# Patient Record
Sex: Female | Born: 1990 | Race: Black or African American | Hispanic: No | Marital: Single | Smoking: Never smoker
Health system: Southern US, Community
[De-identification: ages and names within clinical notes are randomized; demographics above are authoritative.]

## PROBLEM LIST (undated history)

## (undated) DIAGNOSIS — Z789 Other specified health status: Secondary | ICD-10-CM

## (undated) HISTORY — PX: NO PAST SURGERIES: SHX2092

## (undated) HISTORY — PX: TONSILLECTOMY: SUR1361

## (undated) HISTORY — PX: OVARIAN CYST SURGERY: SHX726

---

## 2013-11-13 ENCOUNTER — Inpatient Hospital Stay (HOSPITAL_COMMUNITY)
Admission: AD | Admit: 2013-11-13 | Discharge: 2013-11-13 | Disposition: A | Discharge status: Home / Self Care | Payer: Self-pay | Source: Ambulatory Visit | Attending: Obstetrics and Gynecology | Admitting: Obstetrics and Gynecology

## 2013-11-13 ENCOUNTER — Encounter (HOSPITAL_COMMUNITY): Payer: Self-pay | Admitting: *Deleted

## 2013-11-13 DIAGNOSIS — R079 Chest pain, unspecified: Secondary | ICD-10-CM | POA: Insufficient documentation

## 2013-11-13 DIAGNOSIS — R1013 Epigastric pain: Secondary | ICD-10-CM | POA: Insufficient documentation

## 2013-11-13 DIAGNOSIS — R5383 Other fatigue: Secondary | ICD-10-CM

## 2013-11-13 DIAGNOSIS — K224 Dyskinesia of esophagus: Secondary | ICD-10-CM | POA: Insufficient documentation

## 2013-11-13 DIAGNOSIS — M25519 Pain in unspecified shoulder: Secondary | ICD-10-CM | POA: Insufficient documentation

## 2013-11-13 DIAGNOSIS — Z3202 Encounter for pregnancy test, result negative: Secondary | ICD-10-CM | POA: Insufficient documentation

## 2013-11-13 DIAGNOSIS — R110 Nausea: Secondary | ICD-10-CM

## 2013-11-13 HISTORY — DX: Other specified health status: Z78.9

## 2013-11-13 LAB — URINALYSIS, ROUTINE W REFLEX MICROSCOPIC
BILIRUBIN URINE: NEGATIVE
Glucose, UA: NEGATIVE mg/dL
Hgb urine dipstick: NEGATIVE
KETONES UR: 15 mg/dL — AB
LEUKOCYTES UA: NEGATIVE
NITRITE: NEGATIVE
PROTEIN: NEGATIVE mg/dL
Specific Gravity, Urine: 1.02 (ref 1.005–1.030)
UROBILINOGEN UA: 1 mg/dL (ref 0.0–1.0)
pH: 6.5 (ref 5.0–8.0)

## 2013-11-13 LAB — HCG, QUANTITATIVE, PREGNANCY: hCG, Beta Chain, Quant, S: <1 m[IU]/mL (ref <5)

## 2013-11-13 NOTE — MAU Note (Signed)
I haven't had a period and i've been having chest pains in my R upper chest. Pain is sharp when it comes. I have gotten 4 positive upts.

## 2013-11-13 NOTE — Discharge Instructions (Signed)
Preparing for Pregnancy Preparing for pregnancy (preconceptual care) by getting counseling and information from your caregiver before getting pregnant is a good idea. It will help you and your baby have a better chance to have a healthy, safe pregnancy and delivery of your baby. Make an appointment with your caregiver to talk about your health, medical, and family history and how to prepare yourself before getting pregnant. Your caregiver will do a complete physical exam and a Pap test. They will want to know:  About you, your spouse or partner, and your family's medical and genetic history.  If you are eating a balanced diet and drinking enough fluids.  What vitamins and mineral supplements you are taking. This includes taking folic acid before getting pregnant to help prevent birth defects.  What medications you are taking including prescription, over-the-counter and herbal medications.  If there is any substance abuse like alcohol, smoking, and illegal drugs.  If there is any mental or physical domestic violence.  If there is any risk of sexually transmitted disease between you and your partner.  What immunizations and vaccinations you have had and what you may need before getting pregnant.  If you should get tested for HIV infection.  If there is any exposure to chemical or toxic substances at home or work.  If there are medical problems you have that need to be treated and kept under control before getting pregnant such as diabetes, high blood pressure or others.  If there were any past surgeries, pregnancies and problems with them.  What your current weight is and to set a goal as to how much weight you should gain while pregnant. Also, they will check if you should lose or gain weight before getting pregnant.  What is your exercise routine and what it is safe when you are pregnant.  If there are any physical disabilities that need to be addressed.  About spacing your  pregnancies when there are other children.  If there is a financial problem that may affect you having a child. After talking about the above points with your caregiver, your caregiver will give you advice on how to help treat and work with you on solving any issues, if necessary, before getting pregnant. The goal is to have a healthy and safe pregnancy for you and your baby. You should keep an accurate record of your menstrual periods because it will help in determining your due date. Immunizations that you should have before getting pregnant:   Regular measles, German measles (rubella) and mumps.  Tetanus and diphtheria.  Chickenpox, if not immune.  Herpes zoster (Varicella) if not immune.  Human papilloma virus vaccine (HPV) between the age of 9 and 26 years old.  Hepatitis A vaccine.  Hepatitis B vaccine.  Influenza vaccine.  Pneumococcal vaccine (pneumonia). You should avoid getting pregnant for one month after getting vaccinated with a live virus vaccine such as German measles (rubella) which is in the MMR (Measles, Mumps and Rubella) vaccine. Other immunizations may be necessary depending on where you live, such as malaria. Ask your caregiver if any other immunizations are needed for you. HOME CARE INSTRUCTIONS   Follow the advice of your caregiver.  Before getting pregnant:  Begin taking vitamins, supplements, and 0.4 milligrams folic acid daily.  Get your immunizations up-to-date.  Get help from a nutrition counselor if you do not understand what a balanced diet is, need help with a special medical diet or if you need help to lose or gain weight.    Begin exercising.  Stop smoking, taking illegal drugs, and drinking alcoholic beverages.  Get counseling if there is and type of domestic violence.  Get checked for sexually transmitted diseases including HIV.  Get any medical problems under control (diabetes, high blood pressure, convulsions, asthma or  others).  Resolve any financial concerns or create a plan to do so.  Be sure you and your spouse or partner are ready to have a baby.  Keep an accurate record of your menstrual periods. Document Released: 09/12/2008 Document Revised: 07/21/2013 Document Reviewed: 09/12/2008 Exit[employer] Patient Information 2014 [employer].  

## 2013-11-13 NOTE — MAU Provider Note (Signed)
History     CSN: [id number]  Arrival date and time: 11/13/13 2048   None     Chief Complaint  Patient presents with  . Possible Pregnancy  . Abdominal Pain   HPI  23 yo G0 here for + UPT at home and question about an episode of pain she had several weeks ago.   - had been feeling nauseated. Went to a Psychiatric nurse and took a pregnancy test and was positive, then took another of a different brand and it was negative and then took 3 more of the same brand as the first and they were positive.  - has been feeling really tired - less appetite - came to get an answer because she isn't sure why they were different.   LMP- unsure of dates. Very irregular Sexually active with on and off protection.   No fevers, chills, vomiting, abdominal pain, dysuria, hematuria.    Also had a one time episode (2 weeks ago) of drinking soda and then having sharp epigastric and right sided shoulder pain. Lasted for 2-3 min and went away. Hasn't had any since.  Held her chest and that made it better.  Resolved spontaneously. No associated diaphoresis, SOB, nausea, radiation.        OB History   Grav Para Term Preterm Abortions TAB SAB Ect Mult Living   0               Past Medical History  Diagnosis Date  . Medical history non-contributory     Past Surgical History  Procedure Laterality Date  . No past surgeries      Family History  Problem Relation Age of Onset  . Cancer Maternal Grandmother     History  Substance Use Topics  . Smoking status: Never Smoker   . Smokeless tobacco: Not on file  . Alcohol Use: No    Allergies: No Known Allergies  No prescriptions prior to admission    Review of Systems  All other systems reviewed and are negative.   Physical Exam   Blood pressure 119/83, pulse 100, temperature 98.4 F (36.9 C), resp. rate 16, height 5\' 7"  (1.702 m), weight 98.158 kg (216 lb 6.4 oz).  Physical Exam  Constitutional: She is oriented to person, place, and  time. She appears well-developed and well-nourished.  HENT:  Head: Normocephalic.  Eyes: Pupils are equal, round, and reactive to light.  Neck: Neck supple.  Cardiovascular: Normal rate, regular rhythm, normal heart sounds and intact distal pulses.   No murmur heard. Respiratory: Effort normal and breath sounds normal. She has no wheezes. She has no rales. She exhibits no tenderness.  GI: Soft. Bowel sounds are normal. She exhibits no distension. There is no tenderness.  Musculoskeletal: Normal range of motion.  Neurological: She is alert and oriented to person, place, and time.  Skin: Skin is warm and dry.  Psychiatric: She has a normal mood and affect.    MAU Course  Procedures    Assessment and Plan  1) + home UPT - neg upt here - HCG quant sent and was <1 - discussed with pt the results and that she is not currently pregnant - discussed that should she not want to be pregnant now, she should consider some form of contraception in the future - encouraged her to f/u with PCP for this  2) epigastric pain - most consistent on hx with an esophageal spasm associated with soda on taht occurrence.  Low suspicion for cardiac cause  given resolution, no reoccurrence, location and story.   - reassurance given - advised to talk to PCP should it happen again.   3) d/c to home tonight   Juanna Pudlo L 11/13/2013, 11:47 PM

## 2013-11-14 NOTE — MAU Provider Note (Signed)
Attestation of Attending Supervision of Advanced Practitioner (CNM/NP): Evaluation and management procedures were performed by the Advanced Practitioner under my supervision and collaboration.  I have reviewed the Advanced Practitioner's note and chart, and I agree with the management and plan.  Shanea Karney 11/14/2013 2:30 AM   

## 2013-11-15 LAB — POCT PREGNANCY, URINE: Preg Test, Ur: NEGATIVE

## 2014-05-31 ENCOUNTER — Encounter (HOSPITAL_COMMUNITY): Payer: Self-pay | Admitting: Emergency Medicine

## 2014-05-31 ENCOUNTER — Emergency Department (HOSPITAL_COMMUNITY)
Admission: EM | Admit: 2014-05-31 | Discharge: 2014-05-31 | Disposition: A | Discharge status: Home / Self Care | Payer: Self-pay | Attending: Emergency Medicine | Admitting: Emergency Medicine

## 2014-05-31 DIAGNOSIS — Z789 Other specified health status: Secondary | ICD-10-CM | POA: Insufficient documentation

## 2014-05-31 DIAGNOSIS — Z3202 Encounter for pregnancy test, result negative: Secondary | ICD-10-CM | POA: Insufficient documentation

## 2014-05-31 DIAGNOSIS — R112 Nausea with vomiting, unspecified: Secondary | ICD-10-CM | POA: Insufficient documentation

## 2014-05-31 DIAGNOSIS — R638 Other symptoms and signs concerning food and fluid intake: Secondary | ICD-10-CM | POA: Insufficient documentation

## 2014-05-31 LAB — CBC WITH DIFFERENTIAL/PLATELET
BASOS ABS: 0 10*3/uL (ref 0.0–0.1)
BASOS PCT: 0 % (ref 0–1)
EOS ABS: 0.1 10*3/uL (ref 0.0–0.7)
EOS PCT: 1 % (ref 0–5)
HEMATOCRIT: 39.1 % (ref 36.0–46.0)
Hemoglobin: 13 g/dL (ref 12.0–15.0)
Lymphocytes Relative: 39 % (ref 12–46)
Lymphs Abs: 3.3 10*3/uL (ref 0.7–4.0)
MCH: 25.2 pg — AB (ref 26.0–34.0)
MCHC: 33.2 g/dL (ref 30.0–36.0)
MCV: 75.8 fL — AB (ref 78.0–100.0)
Monocytes Absolute: 0.5 10*3/uL (ref 0.1–1.0)
Monocytes Relative: 6 % (ref 3–12)
Neutro Abs: 4.6 10*3/uL (ref 1.7–7.7)
Neutrophils Relative %: 54 % (ref 43–77)
PLATELETS: 329 10*3/uL (ref 150–400)
RBC: 5.16 MIL/uL — ABNORMAL HIGH (ref 3.87–5.11)
RDW: 13 % (ref 11.5–15.5)
WBC: 8.5 10*3/uL (ref 4.0–10.5)

## 2014-05-31 LAB — COMPREHENSIVE METABOLIC PANEL
ALT: 19 U/L (ref 0–35)
AST: 22 U/L (ref 0–37)
Albumin: 3.2 g/dL — ABNORMAL LOW (ref 3.5–5.2)
Alkaline Phosphatase: 72 U/L (ref 39–117)
Anion gap: 12 (ref 5–15)
BUN: 10 mg/dL (ref 6–23)
CALCIUM: 8.9 mg/dL (ref 8.4–10.5)
CO2: 24 mEq/L (ref 19–32)
Chloride: 105 mEq/L (ref 96–112)
Creatinine, Ser: 1.06 mg/dL (ref 0.50–1.10)
GFR, EST AFRICAN AMERICAN: 84 mL/min — AB (ref >90)
GFR, EST NON AFRICAN AMERICAN: 73 mL/min — AB (ref >90)
Glucose, Bld: 121 mg/dL — ABNORMAL HIGH (ref 70–99)
Potassium: 3.6 mEq/L — ABNORMAL LOW (ref 3.7–5.3)
SODIUM: 141 meq/L (ref 137–147)
TOTAL PROTEIN: 6.4 g/dL (ref 6.0–8.3)
Total Bilirubin: 0.3 mg/dL (ref 0.3–1.2)

## 2014-05-31 LAB — URINALYSIS, ROUTINE W REFLEX MICROSCOPIC
Bilirubin Urine: NEGATIVE
Glucose, UA: NEGATIVE mg/dL
Hgb urine dipstick: NEGATIVE
KETONES UR: 15 mg/dL — AB
Leukocytes, UA: NEGATIVE
Nitrite: NEGATIVE
Protein, ur: NEGATIVE mg/dL
SPECIFIC GRAVITY, URINE: 1.029 (ref 1.005–1.030)
UROBILINOGEN UA: 1 mg/dL (ref 0.0–1.0)
pH: 6 (ref 5.0–8.0)

## 2014-05-31 LAB — LIPASE, BLOOD: LIPASE: 18 U/L (ref 11–59)

## 2014-05-31 LAB — POC URINE PREG, ED: PREG TEST UR: NEGATIVE

## 2014-05-31 MED ORDER — ONDANSETRON HCL 4 MG PO TABS: 4.0000 mg | ORAL_TABLET | Freq: Four times a day (QID) | ORAL | Status: DC

## 2014-05-31 MED ORDER — ONDANSETRON 4 MG PO TBDP
4.0000 mg | ORAL_TABLET | Freq: Once | ORAL | Status: AC
  Administered 2014-05-31: 4 mg via ORAL
  Filled 2014-05-31: qty 1

## 2014-05-31 NOTE — ED Notes (Signed)
Pt arrives to ED c/o periodic vomiting x2 weeks. Denies diarrhea.

## 2014-05-31 NOTE — ED Notes (Signed)
Pt A&OX4, ambulatory at d/c with steady gait, NAD 

## 2014-05-31 NOTE — ED Provider Notes (Signed)
CSN: [id number]     Arrival date & time 05/31/14  0507 History   First MD Initiated Contact with Patient 05/31/14 [phone]     Chief Complaint  Patient presents with  . Vomiting     (Consider location/radiation/quality/duration/timing/severity/associated sxs/prior Treatment) HPI Comments: Patient presents with intermittent vomiting for the past 2 weeks. States she vomits at night when she was working at [employer]. Usually about one time every other day. Today she had 2 episodes of vomiting. Denies any abdominal pain, diarrhea, fever, chest pain or shortness of breath. No recent travel or sick contacts. Endorses poor appetite.  She does not know when her last period was. She thinks she could possibly be pregnant. She denies any back pain, dizziness, lightheadedness, urinary or vaginal symptoms.  The history is provided by the patient.    Past Medical History  Diagnosis Date  . Medical history non-contributory    Past Surgical History  Procedure Laterality Date  . No past surgeries    . Tonsillectomy     Family History  Problem Relation Age of Onset  . Cancer Maternal Grandmother    History  Substance Use Topics  . Smoking status: Never Smoker   . Smokeless tobacco: Not on file  . Alcohol Use: No   OB History   Grav Para Term Preterm Abortions TAB SAB Ect Mult Living   0              Review of Systems  Constitutional: Positive for appetite change. Negative for fever, activity change and fatigue.  Respiratory: Negative for cough, chest tightness and shortness of breath.   Cardiovascular: Negative for chest pain.  Gastrointestinal: Positive for nausea and vomiting. Negative for abdominal pain and diarrhea.  Genitourinary: Negative for dysuria, hematuria, vaginal bleeding and vaginal discharge.  Musculoskeletal: Negative for arthralgias, back pain and myalgias.  Skin: Negative for rash.  Neurological: Negative for dizziness, weakness and headaches.  A complete 10 system review  of systems was obtained and all systems are negative except as noted in the HPI and PMH.      Allergies  Review of patient's allergies indicates no known allergies.  Home Medications   Prior to Admission medications   Medication Sig Start Date End Date Taking? Authorizing Provider  ondansetron (ZOFRAN) 4 MG tablet Take 1 tablet (4 mg total) by mouth every 6 (six) hours. 05/31/14   Glynn Octave, MD   BP 104/62  Pulse 78  Temp(Src) 98.1 F (36.7 C) (Oral)  Resp 19  Ht 5\' 7"  (1.702 m)  Wt 216 lb (97.977 kg)  BMI 33.82 kg/m2  SpO2 95% Physical Exam  Nursing note and vitals reviewed. Constitutional: She is oriented to person, place, and time. She appears well-developed and well-nourished. No distress.  HENT:  Head: Normocephalic and atraumatic.  Mouth/Throat: Oropharynx is clear and moist. No oropharyngeal exudate.  Eyes: Conjunctivae and EOM are normal. Pupils are equal, round, and reactive to light.  Neck: Normal range of motion. Neck supple.  No meningismus.  Cardiovascular: Normal rate, regular rhythm, normal heart sounds and intact distal pulses.   No murmur heard. Pulmonary/Chest: Effort normal and breath sounds normal. No respiratory distress.  Abdominal: Soft. There is no tenderness. There is no rebound and no guarding.  Musculoskeletal: Normal range of motion. She exhibits no edema and no tenderness.  Neurological: She is alert and oriented to person, place, and time. No cranial nerve deficit. She exhibits normal muscle tone. Coordination normal.  No ataxia on finger to nose  bilaterally. No pronator drift. 5/5 strength throughout. CN 2-12 intact. Negative Romberg. Equal grip strength. Sensation intact. Gait is normal.   Skin: Skin is warm.  Psychiatric: She has a normal mood and affect. Her behavior is normal.    ED Course  Procedures (including critical care time) Labs Review Labs Reviewed  CBC WITH DIFFERENTIAL - Abnormal; Notable for the following:    RBC 5.16  (*)    MCV 75.8 (*)    MCH 25.2 (*)    All other components within normal limits  COMPREHENSIVE METABOLIC PANEL - Abnormal; Notable for the following:    Potassium 3.6 (*)    Glucose, Bld 121 (*)    Albumin 3.2 (*)    GFR calc non Af Amer 73 (*)    GFR calc Af Amer 84 (*)    All other components within normal limits  URINALYSIS, ROUTINE W REFLEX MICROSCOPIC - Abnormal; Notable for the following:    APPearance CLOUDY (*)    Ketones, ur 15 (*)    All other components within normal limits  LIPASE, BLOOD  POC URINE PREG, ED    Imaging Review No results found.   EKG Interpretation None      MDM   Final diagnoses:  Nausea and vomiting, vomiting of unspecified type   Intermittent vomiting over the past 2 weeks. No associated symptoms. No fever or abdominal pain. Abdomen soft without peritoneal signs.  HCG negative.  Urinalysis negative. Labs unremarkable. Abdomen soft without peritoneal signs Patient tolerating by mouth. No distress.  Stable for outpatient followup. Return precautions discussed.  BP 104/62  Pulse 78  Temp(Src) 98.1 F (36.7 C) (Oral)  Resp 19  Ht 5\' 7"  (1.702 m)  Wt 216 lb (97.977 kg)  BMI 33.82 kg/m2  SpO2 95%   Glynn OctaveStephen Kailene Steinhart, MD 05/31/14 [phone]

## 2014-05-31 NOTE — Discharge Instructions (Signed)

## 2014-07-10 ENCOUNTER — Encounter (HOSPITAL_COMMUNITY): Payer: Self-pay | Admitting: Emergency Medicine

## 2014-07-10 ENCOUNTER — Emergency Department (HOSPITAL_COMMUNITY)
Admission: EM | Admit: 2014-07-10 | Discharge: 2014-07-10 | Disposition: A | Discharge status: Home / Self Care | Payer: Self-pay | Attending: Emergency Medicine | Admitting: Emergency Medicine

## 2014-07-10 DIAGNOSIS — J029 Acute pharyngitis, unspecified: Secondary | ICD-10-CM | POA: Insufficient documentation

## 2014-07-10 DIAGNOSIS — J020 Streptococcal pharyngitis: Secondary | ICD-10-CM | POA: Insufficient documentation

## 2014-07-10 DIAGNOSIS — Z79899 Other long term (current) drug therapy: Secondary | ICD-10-CM | POA: Insufficient documentation

## 2014-07-10 DIAGNOSIS — J3489 Other specified disorders of nose and nasal sinuses: Secondary | ICD-10-CM | POA: Insufficient documentation

## 2014-07-10 LAB — RAPID STREP SCREEN (MED CTR MEBANE ONLY): Streptococcus, Group A Screen (Direct): POSITIVE — AB

## 2014-07-10 MED ORDER — DEXAMETHASONE 1 MG/ML PO CONC
10.0000 mg | Freq: Once | ORAL | Status: AC
  Administered 2014-07-10: 10 mg via ORAL
  Filled 2014-07-10: qty 10

## 2014-07-10 MED ORDER — ACETAMINOPHEN 500 MG PO TABS: 500.0000 mg | ORAL_TABLET | Freq: Four times a day (QID) | ORAL | Status: DC | PRN

## 2014-07-10 MED ORDER — IBUPROFEN 800 MG PO TABS: 800.0000 mg | ORAL_TABLET | Freq: Three times a day (TID) | ORAL | Status: DC

## 2014-07-10 MED ORDER — IBUPROFEN 400 MG PO TABS
800.0000 mg | ORAL_TABLET | Freq: Once | ORAL | Status: AC
  Administered 2014-07-10: 800 mg via ORAL
  Filled 2014-07-10: qty 2

## 2014-07-10 MED ORDER — PENICILLIN G BENZATHINE 1200000 UNIT/2ML IM SUSP
1.2000 10*6.[IU] | Freq: Once | INTRAMUSCULAR | Status: AC
Start: 2014-07-10 — End: 2014-07-10
  Administered 2014-07-10: 1.2 10*6.[IU] via INTRAMUSCULAR
  Filled 2014-07-10: qty 2

## 2014-07-10 NOTE — ED Provider Notes (Signed)
CSN: [id number]     Arrival date & time 07/10/14  4098 History   First MD Initiated Contact with Patient 07/10/14 [phone]     Chief Complaint  Patient presents with  . Chills  . Generalized Body Aches  . Sore Throat  . Nasal Congestion     (Consider location/radiation/quality/duration/timing/severity/associated sxs/prior Treatment) HPI Comments: Patient is an otherwise healthy 23 yo F presenting to the ED for acute onset of fevers (TMAX 101F), chills, nasal congestion, sore throat that began last night. Patient endorses two episodes of nausea and non-bloody non-bilious emesis yesterday. Alleviating factors: none. Aggravating factors: none. Medications tried prior to arrival: none. No known sick contacts.      Past Medical History  Diagnosis Date  . Medical history non-contributory    Past Surgical History  Procedure Laterality Date  . No past surgeries    . Tonsillectomy     Family History  Problem Relation Age of Onset  . Cancer Maternal Grandmother    History  Substance Use Topics  . Smoking status: Never Smoker   . Smokeless tobacco: Not on file  . Alcohol Use: No   OB History   Grav Para Term Preterm Abortions TAB SAB Ect Mult Living   0              Review of Systems  Constitutional: Positive for fever and chills.  HENT: Positive for congestion, rhinorrhea and sore throat.   All other systems reviewed and are negative.   Allergies  Review of patient's allergies indicates no known allergies.  Home Medications   Prior to Admission medications   Medication Sig Start Date End Date Taking? Authorizing Provider  acetaminophen (TYLENOL) 500 MG tablet Take 1 tablet (500 mg total) by mouth every 6 (six) hours as needed. 07/10/14   Euline Kimbler L Ebony Rickel, PA-C  ibuprofen (ADVIL,MOTRIN) 800 MG tablet Take 1 tablet (800 mg total) by mouth 3 (three) times daily. 07/10/14   Melven Stockard L Kambrie Eddleman, PA-C  ondansetron (ZOFRAN) 4 MG tablet Take 1 tablet (4 mg total) by mouth  every 6 (six) hours. 05/31/14   Glynn Octave, MD   BP 119/85  Pulse 87  Temp(Src) 97.2 F (36.2 C) (Oral)  Resp 18  Ht  (1.702 m)  Wt 214 lb (97.07 kg)  BMI 33.51 kg/m2  SpO2 100% Physical Exam  Nursing note and vitals reviewed. Constitutional: She is oriented to person, place, and time. She appears well-developed and well-nourished. No distress.  HENT:  Head: Normocephalic and atraumatic.  Right Ear: Hearing, tympanic membrane, external ear and ear canal normal.  Left Ear: Hearing, tympanic membrane, external ear and ear canal normal.  Nose: Nose normal.  Mouth/Throat: Uvula is midline and mucous membranes are normal. No trismus in the jaw. No uvula swelling. Posterior oropharyngeal erythema present. No oropharyngeal exudate, posterior oropharyngeal edema or tonsillar abscesses.  Eyes: Conjunctivae are normal.  Neck: Normal range of motion. Neck supple.  Cardiovascular: Normal rate, regular rhythm and normal heart sounds.   Pulmonary/Chest: Effort normal and breath sounds normal. No respiratory distress.  Abdominal: Soft. Normal appearance and bowel sounds are normal. There is no tenderness.  Musculoskeletal: Normal range of motion.  Lymphadenopathy:    She has cervical adenopathy.  Neurological: She is alert and oriented to person, place, and time.  Skin: Skin is warm and dry. She is not diaphoretic.  Psychiatric: She has a normal mood and affect.    ED Course  Procedures (including critical care time) Medications  ibuprofen (  ADVIL,MOTRIN) tablet 800 mg (800 mg Oral Given 07/10/14 0815)  penicillin g benzathine (BICILLIN LA) 1200000 UNIT/2ML injection 1.2 Million Units (1.2 Million Units Intramuscular Given 07/10/14 0842)  dexamethasone (DECADRON) 1 MG/ML solution 10 mg (10 mg Oral Given 07/10/14 0859)    Labs Review Labs Reviewed  RAPID STREP SCREEN - Abnormal; Notable for the following:    Streptococcus, Group A Screen (Direct) POSITIVE (*)    All other components  within normal limits    Imaging Review No results found.   EKG Interpretation None      MDM   Final diagnoses:  Strep pharyngitis    Filed Vitals:   07/10/14 0804  BP: 119/85  Pulse: 87  Temp: 97.2 F (36.2 C)  Resp: 18   Afebrile, NAD, non-toxic appearing, AAOx4.   Pt afebrile with erythematous pharynx, cervical lymphadenopathy, & dysphagia; diagnosis of strep. Treated in the Ed with steroids, NSAIDs, and PCN IM.  Pt appears mildly dehydrated, discussed importance of water rehydration. Presentation non concerning for PTA or infxn spread to soft tissue. No trismus or uvula deviation. Specific return precautions discussed. Pt able to drink water in ED without difficulty with intact air way. Recommended PCP follow up. Patient is stable at time of discharge      Jeannetta Ellis, PA-C 07/10/14 2956

## 2014-07-10 NOTE — Discharge Instructions (Signed)
Please follow up with your primary care physician in 1-2 days. If you do not have one please call the [facility] number listed above. Please alternate between Motrin and Tylenol every three hours for fevers and pain. Please read all discharge instructions and return precautions.  ° °Pharyngitis °Pharyngitis is redness, pain, and swelling (inflammation) of your pharynx.  °CAUSES  °Pharyngitis is usually caused by infection. Most of the time, these infections are from viruses (viral) and are part of a cold. However, sometimes pharyngitis is caused by bacteria (bacterial). Pharyngitis can also be caused by allergies. Viral pharyngitis may be spread from person to person by coughing, sneezing, and personal items or utensils (cups, forks, spoons, toothbrushes). Bacterial pharyngitis may be spread from person to person by more intimate contact, such as kissing.  °SIGNS AND SYMPTOMS  °Symptoms of pharyngitis include:   °· Sore throat.   °· Tiredness (fatigue).   °· Low-grade fever.   °· Headache. °· Joint pain and muscle aches. °· Skin rashes. °· Swollen lymph nodes. °· Plaque-like film on throat or tonsils (often seen with bacterial pharyngitis). °DIAGNOSIS  °Your health care provider will ask you questions about your illness and your symptoms. Your medical history, along with a physical exam, is often all that is needed to diagnose pharyngitis. Sometimes, a rapid strep test is done. Other lab tests may also be done, depending on the suspected cause.  °TREATMENT  °Viral pharyngitis will usually get better in 3-4 days without the use of medicine. Bacterial pharyngitis is treated with medicines that kill germs (antibiotics).  °HOME CARE INSTRUCTIONS  °· Drink enough water and fluids to keep your urine clear or pale yellow.   °· Only take over-the-counter or prescription medicines as directed by your health care provider:   °¨ If you are prescribed antibiotics, make sure you finish them even if you start  to feel better.   °¨ Do not take aspirin.   °· Get lots of rest.   °· Gargle with 8 oz of salt water (½ tsp of salt per 1 qt of water) as often as every 1-2 hours to soothe your throat.   °· Throat lozenges (if you are not at risk for choking) or sprays may be used to soothe your throat. °SEEK MEDICAL CARE IF:  °· You have large, tender lumps in your neck. °· You have a rash. °· You cough up green, yellow-brown, or bloody spit. °SEEK IMMEDIATE MEDICAL CARE IF:  °· Your neck becomes stiff. °· You drool or are unable to swallow liquids. °· You vomit or are unable to keep medicines or liquids down. °· You have severe pain that does not go away with the use of recommended medicines. °· You have trouble breathing (not caused by a stuffy nose). °MAKE SURE YOU:  °· Understand these instructions. °· Will watch your condition. °· Will get help right away if you are not doing well or get worse. °Document Released: 09/30/2005 Document Revised: 07/21/2013 Document Reviewed: 06/07/2013 °[employer]® Patient Information ©2015 [employer]. This information is not intended to replace advice given to you by your health care provider. Make sure you discuss any questions you have with your health care provider. ° °

## 2014-07-10 NOTE — ED Provider Notes (Signed)
Medical screening examination/treatment/procedure(s) were performed by non-physician practitioner and as supervising physician I was immediately available for consultation/collaboration.   EKG Interpretation None        Courtney F Horton, MD 07/10/14 1948 

## 2014-07-10 NOTE — ED Notes (Signed)
Pt presents to department for evaluation of chills, nasal congestion, body aches and sore throat. Ongoing x2 days. Respirations unlabored. Pt is alert and oriented x4. No signs of distress noted.

## 2014-11-22 DIAGNOSIS — Z791 Long term (current) use of non-steroidal anti-inflammatories (NSAID): Secondary | ICD-10-CM | POA: Insufficient documentation

## 2014-11-22 DIAGNOSIS — L509 Urticaria, unspecified: Secondary | ICD-10-CM | POA: Insufficient documentation

## 2014-11-23 ENCOUNTER — Encounter (HOSPITAL_COMMUNITY): Payer: Self-pay | Admitting: Emergency Medicine

## 2014-11-23 ENCOUNTER — Emergency Department (HOSPITAL_COMMUNITY)
Admission: EM | Admit: 2014-11-23 | Discharge: 2014-11-23 | Disposition: A | Discharge status: Home / Self Care | Payer: Self-pay | Attending: Emergency Medicine | Admitting: Emergency Medicine

## 2014-11-23 DIAGNOSIS — L509 Urticaria, unspecified: Secondary | ICD-10-CM

## 2014-11-23 MED ORDER — PREDNISONE 20 MG PO TABS
60.0000 mg | ORAL_TABLET | Freq: Once | ORAL | Status: AC
  Administered 2014-11-23: 60 mg via ORAL
  Filled 2014-11-23: qty 3

## 2014-11-23 MED ORDER — DIPHENHYDRAMINE HCL 25 MG PO TABS: 25.0000 mg | ORAL_TABLET | Freq: Four times a day (QID) | ORAL | Status: DC

## 2014-11-23 MED ORDER — FAMOTIDINE 20 MG PO TABS
20.0000 mg | ORAL_TABLET | Freq: Once | ORAL | Status: AC
  Administered 2014-11-23: 20 mg via ORAL
  Filled 2014-11-23: qty 1

## 2014-11-23 MED ORDER — LORATADINE 10 MG PO TABS
10.0000 mg | ORAL_TABLET | Freq: Once | ORAL | Status: AC
  Administered 2014-11-23: 10 mg via ORAL
  Filled 2014-11-23: qty 1

## 2014-11-23 MED ORDER — PREDNISONE (PAK) 10 MG PO TABS: ORAL_TABLET | Freq: Every day | ORAL | Status: DC

## 2014-11-23 MED ORDER — FAMOTIDINE 20 MG PO TABS: 20.0000 mg | ORAL_TABLET | Freq: Two times a day (BID) | ORAL | Status: DC

## 2014-11-23 NOTE — ED Notes (Signed)
Pt. reports itchy skin rashes/hives onset this evening , respirations unlabored/airway intact .

## 2014-11-23 NOTE — Discharge Instructions (Signed)
Read the information below.  Use the prescribed medication as directed.  Please discuss all new medications with your pharmacist.  You may return to the Emergency Department at any time for worsening condition or any new symptoms that concern you.    If there is any possibility that you might be pregnant, please let your health care provider know and discuss this with the pharmacist to ensure medication safety.  If you develop itching or swelling or the mouth or throat, difficulty swallowing or breathing, or you are unable to tolerate fluids by mouth, return to the ER immediately for a recheck.       Allergies Allergies may happen from anything your body is sensitive to. This may be food, medicines, pollens, chemicals, and nearly anything around you in everyday life that produces allergens. An allergen is anything that causes an allergy producing substance. Heredity is often a factor in causing these problems. This means you may have some of the same allergies as your parents. Food allergies happen in all age groups. Food allergies are some of the most severe and life threatening. Some common food allergies are cow's milk, seafood, eggs, nuts, wheat, and soybeans. SYMPTOMS   Swelling around the mouth.  An itchy red rash or hives.  Vomiting or diarrhea.  Difficulty breathing. SEVERE ALLERGIC REACTIONS ARE LIFE-THREATENING. This reaction is called anaphylaxis. It can cause the mouth and throat to swell and cause difficulty with breathing and swallowing. In severe reactions only a trace amount of food (for example, peanut oil in a salad) may cause death within seconds. Seasonal allergies occur in all age groups. These are seasonal because they usually occur during the same season every year. They may be a reaction to molds, grass pollens, or tree pollens. Other causes of problems are house dust mite allergens, pet dander, and mold spores. The symptoms often consist of nasal congestion, a runny itchy  nose associated with sneezing, and tearing itchy eyes. There is often an associated itching of the mouth and ears. The problems happen when you come in contact with pollens and other allergens. Allergens are the particles in the air that the body reacts to with an allergic reaction. This causes you to release allergic antibodies. Through a chain of events, these eventually cause you to release histamine into the blood stream. Although it is meant to be protective to the body, it is this release that causes your discomfort. This is why you were given anti-histamines to feel better. If you are unable to pinpoint the offending allergen, it may be determined by skin or blood testing. Allergies cannot be cured but can be controlled with medicine. Hay fever is a collection of all or some of the seasonal allergy problems. It may often be treated with simple over-the-counter medicine such as diphenhydramine. Take medicine as directed. Do not drink alcohol or drive while taking this medicine. Check with your caregiver or package insert for child dosages. If these medicines are not effective, there are many new medicines your caregiver can prescribe. Stronger medicine such as nasal spray, eye drops, and corticosteroids may be used if the first things you try do not work well. Other treatments such as immunotherapy or desensitizing injections can be used if all else fails. Follow up with your caregiver if problems continue. These seasonal allergies are usually not life threatening. They are generally more of a nuisance that can often be handled using medicine. HOME CARE INSTRUCTIONS   If unsure what causes a reaction, keep a  diary of foods eaten and symptoms that follow. Avoid foods that cause reactions.  If hives or rash are present:  Take medicine as directed.  You may use an over-the-counter antihistamine (diphenhydramine) for hives and itching as needed.  Apply cold compresses (cloths) to the skin or take  baths in cool water. Avoid hot baths or showers. Heat will make a rash and itching worse.  If you are severely allergic:  Following a treatment for a severe reaction, hospitalization is often required for closer follow-up.  Wear a medic-alert bracelet or necklace stating the allergy.  You and your family must learn how to give adrenaline or use an anaphylaxis kit.  If you have had a severe reaction, always carry your anaphylaxis kit or EpiPen with you. Use this medicine as directed by your caregiver if a severe reaction is occurring. Failure to do so could have a fatal outcome. SEEK MEDICAL CARE IF:  You suspect a food allergy. Symptoms generally happen within 30 minutes of eating a food.  Your symptoms have not gone away within 2 days or are getting worse.  You develop new symptoms.  You want to retest yourself or your child with a food or drink you think causes an allergic reaction. Never do this if an anaphylactic reaction to that food or drink has happened before. Only do this under the care of a caregiver. SEEK IMMEDIATE MEDICAL CARE IF:   You have difficulty breathing, are wheezing, or have a tight feeling in your chest or throat.  You have a swollen mouth, or you have hives, swelling, or itching all over your body.  You have had a severe reaction that has responded to your anaphylaxis kit or an EpiPen. These reactions may return when the medicine has worn off. These reactions should be considered life threatening. MAKE SURE YOU:   Understand these instructions.  Will watch your condition.  Will get help right away if you are not doing well or get worse. Document Released: 12/24/2002 Document Revised: 01/25/2013 Document Reviewed: 05/30/2008 [facility] Patient Information 2015 [place], [place]. This information is not intended to replace advice given to you by your health care provider. Make sure you discuss any questions you have with your health care  provider.  Hives Hives are itchy, red, swollen areas of the skin. They can vary in size and location on your body. Hives can come and go for hours or several days (acute hives) or for several weeks (chronic hives). Hives do not spread from person to person (noncontagious). They may get worse with scratching, exercise, and emotional stress. CAUSES   Allergic reaction to food, additives, or drugs.  Infections, including the common cold.  Illness, such as vasculitis, lupus, or thyroid disease.  Exposure to sunlight, heat, or cold.  Exercise.  Stress.  Contact with chemicals. SYMPTOMS   Red or white swollen patches on the skin. The patches may change size, shape, and location quickly and repeatedly.  Itching.  Swelling of the hands, feet, and face. This may occur if hives develop deeper in the skin. DIAGNOSIS  Your caregiver can usually tell what is wrong by performing a physical exam. Skin or blood tests may also be done to determine the cause of your hives. In some cases, the cause cannot be determined. TREATMENT  Mild cases usually get better with medicines such as antihistamines. Severe cases may require an emergency epinephrine injection. If the cause of your hives is known, treatment includes avoiding that trigger.  HOME CARE INSTRUCTIONS  Avoid causes that trigger your hives.  Take antihistamines as directed by your caregiver to reduce the severity of your hives. Non-sedating or low-sedating antihistamines are usually recommended. Do not drive while taking an antihistamine.  Take any other medicines prescribed for itching as directed by your caregiver.  Wear loose-fitting clothing.  Keep all follow-up appointments as directed by your caregiver. SEEK MEDICAL CARE IF:   You have persistent or severe itching that is not relieved with medicine.  You have painful or swollen joints. SEEK IMMEDIATE MEDICAL CARE IF:   You have a fever.  Your tongue or lips are  swollen.  You have trouble breathing or swallowing.  You feel tightness in the throat or chest.  You have abdominal pain. These problems may be the first sign of a life-threatening allergic reaction. Call your local emergency services (911 in U.S.). MAKE SURE YOU:   Understand these instructions.  Will watch your condition.  Will get help right away if you are not doing well or get worse. Document Released: 09/30/2005 Document Revised: 10/05/2013 Document Reviewed: 12/24/2011 [facility] Patient Information 2015 [place], [place]. This information is not intended to replace advice given to you by your health care provider. Make sure you discuss any questions you have with your health care provider.    Emergency Department Resource Guide 1) Find a Doctor and Pay Out of Pocket Although you won't have to find out who is covered by your insurance plan, it is a good idea to ask around and get recommendations. You will then need to call the office and see if the doctor you have chosen will accept you as a new patient and what types of options they offer for patients who are self-pay. Some doctors offer discounts or will set up payment plans for their patients who do not have insurance, but you will need to ask so you aren't surprised when you get to your appointment.  2) Contact Your Local Health Department Not all health departments have doctors that can see patients for sick visits, but many do, so it is worth a call to see if yours does. If you don't know where your local health department is, you can check in your phone book. The CDC also has a tool to help you locate your state's health department, and many state websites also have listings of all of their local health departments.  3) Find a [facility] If your illness is not likely to be very severe or complicated, you may want to try a walk in clinic. These are popping up all over the country in pharmacies, drugstores, and shopping  centers. They're usually staffed by nurse practitioners or physician assistants that have been trained to treat common illnesses and complaints. They're usually fairly quick and inexpensive. However, if you have serious medical issues or chronic medical problems, these are probably not your best option.  No Primary Care Doctor: - Call Health Connect at  [phone] - they can help you locate a primary care doctor that  accepts your insurance, provides certain services, etc. - Physician Referral Service- [phone]  Chronic Pain Problems: Organization         Address  Phone   Notes  [facility]  [phone] Patients need to be referred by their primary care doctor.   Medication Assistance: Organization         Address  Phone   Notes  [facility] Medication Assistance Program [address] [place], [place]  [address] [phone] --Must be a resident of [facility]) -- Must have NO insurance coverage whatsoever (no Medicaid/ Medicare, etc.) -- The pt. MUST have a primary care doctor that directs their care regularly and follows them in the community   MedAssist  [phone]   [employer]  [phone]    Agencies that provide inexpensive medical care: Organization         Address  Phone   Notes  [place]  [phone]   [facility]    [phone]   [facility] [place], [place] [address] [phone]   [place] [address] [place] [phone]   Planned Parenthood    [phone]   [facility]    [phone]   [place] and [place] [address], [place] Phone:  [phone], Fax:  [phone] Hours of Operation:  9 am - 6 pm, M-F.  Also accepts Medicaid/Medicare and self-pay.  [facility] for [place], [address], [place] Phone: [phone], Fax: [phone]. Hours of Operation:  8:30 am - 5:30 pm, M-F.  Also accepts Medicaid and self-pay.  [facility] [place] [address], [place] Phone: [phone]   [place], [place], [place] [phone], Ext. [phone] Mondays & Thursdays: 7-9 AM.  First 15 patients are seen on a first come, first serve basis.    [place] Providers:  Organization         Address  Phone   Notes  [facility] [address], [place] [phone] Also accepts self-pay patients.  [facility] [address], [place]  [phone]   [place], [address], [place] [phone]   [facility] [address] [place] [phone]   Lucianne Lei [address], [place]   [phone] Only accepts [place] Access [place] patients after they have their name applied to their card.   Self-Pay (no insurance) in [facility]:  Organization         Address  Phone   Notes  Sickle Cell Patients, [facility] Internal Medicine [place] [phone]   [facility] [place] [phone]   [facility] [place]  [place], [place], [place] [phone]   [facility]/Dr. Osei-Bonsu  [address], [place] or [address], [place] [phone] Phone number for both [place] and [place] locations is the same.  [facility] [address], [place] [phone]   [facility] [address] [place] or [address] [phone]   [facility] [address], [place] [phone], phone; [phone], fax Sees patients 1st and 3rd Saturday of every month.  Must not qualify for public or private insurance (i.e.  Medicaid, Medicare, [place] Health Choice, Veterans' Benefits)  Household income should be no more than 200% of the poverty level The clinic cannot treat you if you are pregnant or think you are pregnant  Sexually transmitted diseases are not treated at the clinic.    Dental Care: Organization  Address  Phone  Notes  [facility] [place], [place] [phone] Accepts children up to age 38 who are enrolled in [place] or [place]; pregnant women with a Medicaid card; and children who have applied for Medicaid or [place] Health Choice, but were declined, whose parents can pay a reduced fee at time of service.  [facility] Rogan Ecklund Department of [facility]  [address], [place] [phone] Accepts children up to age 31 who are enrolled in [place] or [place]; pregnant women with a Medicaid card; and children who have applied for Medicaid or [place] Health Choice, but were declined, whose parents can pay a reduced fee at time of service.  [place] Adult Dental Access PROGRAM  [place] [phone] Patients are seen by appointment only. Walk-ins are not accepted. [place] will see patients 65 years of age and older. Monday - Tuesday (8am-5pm) Most Wednesdays (8:30-5pm) $30 per visit, cash only  [facility] Adult Dental Access PROGRAM  [address], [facility] [phone] Patients are seen by appointment only. Walk-ins are not accepted. [place] will see patients 6 years of age and older. One Wednesday Evening (Monthly: Volunteer Based).  $30 per visit, cash only  [place]  [phone] for adults; Children under age 30, call Graduate Pediatric Dentistry at [phone]. Children aged 34-14, please call [phone] to request a pediatric application.  Dental services are provided in all areas of dental care including fillings,  crowns and bridges, complete and partial dentures, implants, gum treatment, root canals, and extractions. Preventive care is also provided. Treatment is provided to both adults and children. Patients are selected via a lottery and there is often a waiting list.   [facility] [address], [place]  [phone] [email or web address]   [facility] [address] [place], [place] [phone], Ext. [phone] Second and Fourth Thursday of each month, opens at 6:30 AM; Clinic ends at 9 AM.  Patients are seen on a first-come first-served basis, and a limited number are seen during each clinic.   [facility]  [address] [place], [place] [phone]   Eligibility Requirements You must have lived in [place], [place], or [place] counties for at least the last three months.   You cannot be eligible for state or federal sponsored [employer], including [employer], [place], or [employer].   You generally cannot be eligible for healthcare insurance through your employer.    How to apply: Eligibility screenings are held every Tuesday and Wednesday afternoon from 1:00 pm until 4:00 pm. You do not need an appointment for the interview!  [facility] [address] [place], [place]   [place]  [place] Department  [place]  [phone]    Behavioral Health Resources in the Community: Intensive Outpatient Programs Organization         Address  Phone  Notes  [place]. [address], [place], [place] [phone]   [facility] [address], [place], [place]   ADS: Alcohol & Drug Svcs [address], [place], [place]   [place] [address],  [place], [place] or [phone]   Substance Abuse  Resources  Organization         Address  Phone  Notes  Alcohol and Drug Services  [place]  [phone]   [facility] [place]  [phone]   Chinita Pester  [phone]   Residential & Outpatient Substance Abuse Program  [phone]   Psychological Services Organization         Address  Phone  Notes  [facility] [place]  [place]  [phone]   [place] [address], [place] or [phone]    Mobile Crisis Teams Organization         Address  Phone  Notes  [facility], Mobile Crisis Care Unit  [phone]   [facility] Psych[facility]  [address] [place], [place]   [place] [address], [place] [phone]    Self-Help/Support Groups Organization         Address  Phone             Notes  [place]. of [place] - variety of support groups  [place] Call for more information  Narcotics Anonymous (NA), [facility] [address], [employer] [place]  2 meetings at this location   Special educational needs teacher         Address  Phone  Notes  ASAP Residential Treatment [place],    [place]  [phone]   [facility]  [address] [place] [address], [place], [place]   [place], [place] [phone] Admissions: 8am-3pm M-F  Incentives Substance [place] [address],    [place], [place] [phone]   [facility] [address] [place], [place], [place]   [facility] [address],  [place], [place]   [facility] - Intensive Outpatient [address], [place], [place]   [facility] [address].,  [place], [place] [phone] or [phone]   [facility] (RTS) [address], [place], [place] Accepts Medicaid  [facility] [place] [address],  [place] [phone] Substance Abuse/Addiction Treatment   [facility] Organization         Address  Phone  Notes  [facility]  [phone]   Domenic Schwab, PhD [address] [place], [place]   [phone] or [phone]   [place], [place] [phone]   [facility] [address] [place], [place] [phone] Insurance/Medicaid/sponsorship through [facility] and Families [address] [place]                                    [place], [place] [phone] [place] [address], [place] [phone]    Dr. Adele Schilder  [phone]   [facility] [place] Dept. 1) [address] [place] 2) [place] 3)  [place] [address], [place] [phone]  [phone]   [place] [phone] or [phone] (After Hours)

## 2014-11-23 NOTE — ED Provider Notes (Signed)
CSN: [id number]     Arrival date & time 11/22/14  2357 History   First MD Initiated Contact with Patient 11/23/14 0001     No chief complaint on file.    (Consider location/radiation/quality/duration/timing/severity/associated sxs/prior Treatment) HPI   Patient presents with pruritic rash she believes is hives that started about 4 hours ago.  Has had similar rash years ago from unknown source.  Has never seen an allergist.  Denies itching or swelling of the mouth or throat, any difficulty swallowing or breathing.  Denies changes in personal care products including detergents, soaps, shampoos, lotions, perfumes. Denies new clothing or furniture.  Denies travel, visiting other people's houses.  Denies any recent camping or time spent in the woods.  Denies known tick bites.  Denies chemical or plant exposures.  Denies new foods.  Denies any new medications or medication changes.    Past Medical History  Diagnosis Date  . Medical history non-contributory    Past Surgical History  Procedure Laterality Date  . No past surgeries    . Tonsillectomy     Family History  Problem Relation Age of Onset  . Cancer Maternal Grandmother    History  Substance Use Topics  . Smoking status: Never Smoker   . Smokeless tobacco: Not on file  . Alcohol Use: No   OB History    Gravida Para Term Preterm AB TAB SAB Ectopic Multiple Living   0              Review of Systems  Constitutional: Negative for fever and chills.  HENT: Negative for sore throat and trouble swallowing.   Respiratory: Negative for cough, shortness of breath, wheezing and stridor.   Musculoskeletal: Negative for neck pain.  Skin: Positive for rash. Negative for wound.  Allergic/Immunologic: Negative for environmental allergies, food allergies and immunocompromised state.  Psychiatric/Behavioral: Negative for self-injury.      Allergies  Review of patient's allergies indicates no known allergies.  Home Medications   Prior  to Admission medications   Medication Sig Start Date End Date Taking? Authorizing Provider  acetaminophen (TYLENOL) 500 MG tablet Take 1 tablet (500 mg total) by mouth every 6 (six) hours as needed. 07/10/14   Jennifer L Piepenbrink, PA-C  ibuprofen (ADVIL,MOTRIN) 800 MG tablet Take 1 tablet (800 mg total) by mouth 3 (three) times daily. 07/10/14   Jennifer L Piepenbrink, PA-C  ondansetron (ZOFRAN) 4 MG tablet Take 1 tablet (4 mg total) by mouth every 6 (six) hours. 05/31/14   Glynn Octave, MD   BP 122/78 mmHg  Pulse 86  Temp(Src) 98 F (36.7 C)  Resp 20  SpO2 100%  LMP  Physical Exam  Constitutional: She appears well-developed and well-nourished. No distress.  HENT:  Head: Normocephalic and atraumatic.  Mouth/Throat: Uvula is midline and oropharynx is clear and moist. Mucous membranes are not dry. No uvula swelling. No oropharyngeal exudate, posterior oropharyngeal edema, posterior oropharyngeal erythema or tonsillar abscesses.  Neck: Neck supple.  Cardiovascular: Normal rate and regular rhythm.   Pulmonary/Chest: Effort normal and breath sounds normal. No stridor. No respiratory distress. She has no wheezes. She has no rales.  Neurological: She is alert.  Skin: Rash noted. She is not diaphoretic.  Urticarial rash over bilateral arms, sparsely over torso.    Nursing note and vitals reviewed.   ED Course  Procedures (including critical care time) Labs Review Labs Reviewed - No data to display  Imaging Review No results found.   EKG Interpretation None  MDM   Final diagnoses:  None   Afebrile, nontoxic patient with urticarial rash from unknown source.  No airway concerns.  Given prednisone, claritin, pepcid in ED and monitored for improvement. Plan for D/C home with prednisone, benadryl, pepcid, allergy follow up.  Discussed result, findings, treatment, and follow up  with patient.  Pt given return precautions.  Pt verbalizes understanding and agrees with plan.          Trixie DredgeEmily Jase Reep, PA-C 11/23/14 [id number]  Ward GivensIva L Knapp, MD 11/23/14 [phone]

## 2015-01-06 ENCOUNTER — Emergency Department (HOSPITAL_COMMUNITY)
Admission: EM | Admit: 2015-01-06 | Discharge: 2015-01-07 | Disposition: A | Discharge status: Home / Self Care | Payer: Self-pay | Attending: Emergency Medicine | Admitting: Emergency Medicine

## 2015-01-06 ENCOUNTER — Encounter (HOSPITAL_COMMUNITY): Payer: Self-pay | Admitting: Emergency Medicine

## 2015-01-06 ENCOUNTER — Emergency Department (HOSPITAL_COMMUNITY): Payer: Self-pay

## 2015-01-06 DIAGNOSIS — R002 Palpitations: Secondary | ICD-10-CM | POA: Insufficient documentation

## 2015-01-06 DIAGNOSIS — Z791 Long term (current) use of non-steroidal anti-inflammatories (NSAID): Secondary | ICD-10-CM | POA: Insufficient documentation

## 2015-01-06 DIAGNOSIS — Z7952 Long term (current) use of systemic steroids: Secondary | ICD-10-CM | POA: Insufficient documentation

## 2015-01-06 DIAGNOSIS — R0789 Other chest pain: Secondary | ICD-10-CM | POA: Insufficient documentation

## 2015-01-06 LAB — CBC
HCT: 40.3 % (ref 36.0–46.0)
Hemoglobin: 13.3 g/dL (ref 12.0–15.0)
MCH: 25.3 pg — ABNORMAL LOW (ref 26.0–34.0)
MCHC: 33 g/dL (ref 30.0–36.0)
MCV: 76.6 fL — AB (ref 78.0–100.0)
PLATELETS: 310 10*3/uL (ref 150–400)
RBC: 5.26 MIL/uL — ABNORMAL HIGH (ref 3.87–5.11)
RDW: 13.2 % (ref 11.5–15.5)
WBC: 6.9 10*3/uL (ref 4.0–10.5)

## 2015-01-06 LAB — BASIC METABOLIC PANEL
Anion gap: 6 (ref 5–15)
BUN: 8 mg/dL (ref 6–23)
CO2: 28 mmol/L (ref 19–32)
CREATININE: 0.97 mg/dL (ref 0.50–1.10)
Calcium: 8.7 mg/dL (ref 8.4–10.5)
Chloride: 104 mmol/L (ref 96–112)
GFR calc non Af Amer: 81 mL/min — ABNORMAL LOW (ref >90)
GLUCOSE: 90 mg/dL (ref 70–99)
POTASSIUM: 3.6 mmol/L (ref 3.5–5.1)
Sodium: 138 mmol/L (ref 135–145)

## 2015-01-06 LAB — I-STAT TROPONIN, ED: Troponin i, poc: 0.01 ng/mL (ref 0.00–0.08)

## 2015-01-06 NOTE — ED Notes (Signed)
Pt reports she had L sided cp last night slightly then she took a caffeine pill and drank coffee. After this the pain intensified and she felt like her heart was pounding. Pt also having sob.

## 2015-01-07 MED ORDER — IBUPROFEN 800 MG PO TABS: 800.0000 mg | ORAL_TABLET | Freq: Three times a day (TID) | ORAL | Status: DC

## 2015-01-07 NOTE — ED Provider Notes (Signed)
CSN: [id number]     Arrival date & time 01/06/15  2054 History   First MD Initiated Contact with Patient 01/07/15 0009     Chief Complaint  Patient presents with  . Chest Pain     (Consider location/radiation/quality/duration/timing/severity/associated sxs/prior Treatment) HPI Comments: Patient presents with complaint of left upper chest pain described as a soreness which started gradually 24 hours ago and is been constant. Onset was acute. 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 She drank coffee and took a caffeine pill to help stay awake last night and this seemed to make her symptoms worse. She had a sensation of a racing heart for some time but this resolved. She denies shortness of breath to me. She states that the pain in her chest is worse with movement of her left arm and it is difficult for her to drive because she has to move her arm. Patient denies risk factors for pulmonary embolism including: unilateral leg swelling, history of DVT/PE/other blood clots, use of estrogens, recent immobilizations, recent surgery, recent travel (>4hr segment), malignancy, hemoptysis. No FH of early CAD.     Patient is a 24 y.o. female presenting with chest pain. The history is provided by the patient.  Chest Pain Associated symptoms: palpitations   Associated symptoms: no abdominal pain, no back pain, no cough, no diaphoresis, no fever, no nausea, no shortness of breath and not vomiting     Past Medical History  Diagnosis Date  . Medical history non-contributory    Past Surgical History  Procedure Laterality Date  . No past surgeries    . Tonsillectomy     Family History  Problem Relation Age of Onset  . Cancer Maternal Grandmother    History  Substance Use Topics  . Smoking status: Never Smoker   . Smokeless tobacco: Not on file  . Alcohol Use: No   OB History    Gravida Para Term Preterm AB TAB SAB Ectopic Multiple Living   0               Review of Systems  Constitutional: Negative for fever and diaphoresis.  Eyes: Negative for redness.  Respiratory: Negative for cough and shortness of breath.   Cardiovascular: Positive for chest pain and palpitations. Negative for leg swelling.  Gastrointestinal: Negative for nausea, vomiting and abdominal pain.  Genitourinary: Negative for dysuria.  Musculoskeletal: Negative for back pain and neck pain.  Skin: Negative for rash.  Neurological: Negative for syncope and light-headedness.  Psychiatric/Behavioral: The patient is not nervous/anxious.       Allergies  Review of patient's allergies indicates no known allergies.  Home Medications   Prior to Admission medications   Medication Sig Start Date End Date Taking? Authorizing Provider  acetaminophen (TYLENOL) 500 MG tablet Take 1 tablet (500 mg total) by mouth every 6 (six) hours as needed. 07/10/14   Jennifer Piepenbrink, PA-C  diphenhydrAMINE (BENADRYL) 25 MG tablet Take 1 tablet (25 mg total) by mouth every 6 (six) hours. X 3 days then PRN itching, rash, allergic reaction 11/23/14   Trixie Dredge, PA-C  famotidine (PEPCID) 20 MG tablet Take 1 tablet (20 mg total) by mouth 2 (two) times daily. X 3 days then PRN itching, rash, allergic reaction 11/23/14   Trixie Dredge, PA-C  ibuprofen (ADVIL,MOTRIN) 800 MG tablet Take 1 tablet (800 mg total) by mouth 3 (three) times daily. 07/10/14   Jennifer Piepenbrink, PA-C  ondansetron (ZOFRAN) 4 MG tablet Take 1 tablet (4  mg total) by mouth every 6 (six) hours. 05/31/14   Glynn OctaveStephen Rancour, MD  predniSONE (STERAPRED UNI-PAK) 10 MG tablet Take by mouth daily. Day 1: take 6 tabs.  Day 2: 5 tabs  Day 3: 4 tabs  Day 4: 3 tabs  Day 5: 2 tabs  Day 6: 1 tab 11/23/14   Trixie DredgeEmily West, PA-C   BP 125/85 mmHg  Pulse 75  Temp(Src) 98.3 F (36.8 C) (Oral)  Resp 16  Ht 5\' 7"  (1.702 m)  Wt 200 lb (90.719 kg)  BMI 31.32 kg/m2  SpO2 100%  LMP 01/04/2015 (Approximate)   Physical Exam  Constitutional: She appears  well-developed and well-nourished.  HENT:  Head: Normocephalic and atraumatic.  Mouth/Throat: Mucous membranes are normal. Mucous membranes are not dry.  Eyes: Conjunctivae are normal.  Neck: Trachea normal and normal range of motion. Neck supple. Normal carotid pulses and no JVD present. No muscular tenderness present. Carotid bruit is not present. No tracheal deviation present.  Cardiovascular: Normal rate, regular rhythm, S1 normal, S2 normal, normal heart sounds and intact distal pulses.  Exam reveals no decreased pulses.   No murmur heard. Pulmonary/Chest: Effort normal. No respiratory distress. She has no wheezes. She exhibits tenderness. She exhibits no mass.    Abdominal: Soft. Normal aorta and bowel sounds are normal. There is no tenderness. There is no rebound and no guarding.  Musculoskeletal: Normal range of motion.  Neurological: She is alert.  Skin: Skin is warm and dry. She is not diaphoretic. No cyanosis. No pallor.  Psychiatric: She has a normal mood and affect.  Nursing note and vitals reviewed.   ED Course  Procedures (including critical care time) Labs Review Labs Reviewed  CBC - Abnormal; Notable for the following:    RBC 5.26 (*)    MCV 76.6 (*)    MCH 25.3 (*)    All other components within normal limits  BASIC METABOLIC PANEL - Abnormal; Notable for the following:    GFR calc non Af Amer 81 (*)    All other components within normal limits  I-STAT TROPOININ, ED    Imaging Review Dg Chest 2 View  01/06/2015   CLINICAL DATA:  Chest pain  EXAM: CHEST  2 VIEW  COMPARISON:  None.  FINDINGS: Cardiomediastinal silhouette is unremarkable. No acute infiltrate or pleural effusion. No pulmonary edema. Bony thorax is unremarkable.  IMPRESSION: No active cardiopulmonary disease.   Electronically Signed   By: Natasha MeadLiviu  Pop M.D.   On: 01/06/2015 21:39     EKG Interpretation   Date/Time:  Friday January 06 2015 21:06:12 EDT Ventricular Rate:  104 PR Interval:  112 QRS  Duration: 80 QT Interval:  354 QTC Calculation: 465 R Axis:   67 Text Interpretation:  Sinus tachycardia Nonspecific T wave abnormality  Abnormal ECG No old tracing to compare Confirmed by [facility] HospitalGLICK  MD, DAVID  ([id number]) on 01/06/2015 11:53:18 PM       12:44 AM Patient seen and examined.    Vital signs reviewed and are as follows: BP 125/85 mmHg  Pulse 75  Temp(Src) 98.3 F (36.8 C) (Oral)  Resp 16  Ht 5\' 7"  (1.702 m)  Wt 200 lb (90.719 kg)  BMI 31.32 kg/m2  SpO2 100%  LMP 01/04/2015 (Approximate)  Patient informed of results. Will start on NSAIDs for chest wall tenderness. Discussed that this might be muscle strain versus costochondritis. Discussed rest, work note given.   Patient urged to return with worsening symptoms or other concerns. Patient verbalized understanding  and agrees with plan.    MDM   Final diagnoses:  Chest wall pain   Patient with chest wall tenderness, reproducible with palpation and movement of her arm. Feel patient is low risk for ACS given history (poor story for ACS/MI), negative troponin(s), normal EKG. Pt is PERC negative and I do not suspect PE. Conservative treatment indicated.      Renne Crigler, PA-C 01/07/15 1308  Dione Booze, MD 01/07/15 [phone]

## 2015-01-07 NOTE — Discharge Instructions (Signed)
Please read and follow all provided instructions.  Your diagnoses today include:  1. Chest wall pain    Tests performed today include:  An EKG of your heart  A chest x-ray - normal  Cardiac enzymes - a blood test for heart muscle damage  Blood counts and electrolytes  Vital signs. See below for your results today.   Medications prescribed:   Ibuprofen (Motrin, Advil) - anti-inflammatory pain medication  Do not exceed 600mg  ibuprofen every 6 hours, take with food  You have been prescribed an anti-inflammatory medication or NSAID. Take with food. Take smallest effective dose for the shortest duration needed for your pain. Stop taking if you experience stomach pain or vomiting.   Take any prescribed medications only as directed.  Follow-up instructions: Please follow-up with your primary care provider as soon as you can for further evaluation of your symptoms.   Return instructions:  SEEK IMMEDIATE MEDICAL ATTENTION IF:  You have severe chest pain, especially if the pain is crushing or pressure-like and spreads to the arms, back, neck, or jaw, or if you have sweating, nausea (feeling sick to your stomach), or shortness of breath. THIS IS AN EMERGENCY. Don't wait to see if the pain will go away. Get medical help at once. Call 911 or 0 (operator). DO NOT drive yourself to the hospital.   Your chest pain gets worse and does not go away with rest.   You have an attack of chest pain lasting longer than usual, despite rest and treatment with the medications your caregiver has prescribed.   You wake from sleep with chest pain or shortness of breath.  You feel dizzy or faint.  You have chest pain not typical of your usual pain for which you originally saw your caregiver.   You have any other emergent concerns regarding your health.  Additional Information: Chest pain comes from many different causes. Your caregiver has diagnosed you as having chest pain that is not specific for  one problem, but does not require admission.  You are at low risk for an acute heart condition or other serious illness.   Your vital signs today were: BP 125/85 mmHg   Pulse 75   Temp(Src) 98.3 F (36.8 C) (Oral)   Resp 16   Ht 5\' 7"  (1.702 m)   Wt 200 lb (90.719 kg)   BMI 31.32 kg/m2   SpO2 100%   LMP 01/04/2015 (Approximate) If your blood pressure (BP) was elevated above 135/85 this visit, please have this repeated by your doctor within one month. --------------

## 2016-05-26 ENCOUNTER — Encounter (HOSPITAL_COMMUNITY): Payer: Self-pay

## 2016-05-26 ENCOUNTER — Emergency Department (HOSPITAL_COMMUNITY)
Admission: EM | Admit: 2016-05-26 | Discharge: 2016-05-27 | Disposition: A | Discharge status: Home / Self Care | Payer: Self-pay | Attending: Emergency Medicine | Admitting: Emergency Medicine

## 2016-05-26 DIAGNOSIS — N939 Abnormal uterine and vaginal bleeding, unspecified: Secondary | ICD-10-CM | POA: Insufficient documentation

## 2016-05-26 NOTE — ED Triage Notes (Signed)
Pt states that she has had heavy vaginal bleeding for the last 2 months. Pt states that it is more than her normal period. She states that she has passed some clots, but the flow is steady.Pt also states that she has had and increase in headaches and fatigue. A&Ox4.

## 2016-05-27 LAB — BASIC METABOLIC PANEL
Anion gap: 4 — ABNORMAL LOW (ref 5–15)
BUN: 9 mg/dL (ref 6–20)
CHLORIDE: 103 mmol/L (ref 101–111)
CO2: 28 mmol/L (ref 22–32)
Calcium: 9 mg/dL (ref 8.9–10.3)
Creatinine, Ser: 0.81 mg/dL (ref 0.44–1.00)
GFR calc Af Amer: >60 mL/min (ref >60)
GFR calc non Af Amer: >60 mL/min (ref >60)
Glucose, Bld: 97 mg/dL (ref 65–99)
POTASSIUM: 3.7 mmol/L (ref 3.5–5.1)
Sodium: 135 mmol/L (ref 135–145)

## 2016-05-27 LAB — CBC WITH DIFFERENTIAL/PLATELET
Basophils Absolute: 0 10*3/uL (ref 0.0–0.1)
Basophils Relative: 0 %
Eosinophils Absolute: 0.1 10*3/uL (ref 0.0–0.7)
Eosinophils Relative: 2 %
HEMATOCRIT: 37.5 % (ref 36.0–46.0)
HEMOGLOBIN: 12.3 g/dL (ref 12.0–15.0)
LYMPHS ABS: 4.2 10*3/uL — AB (ref 0.7–4.0)
LYMPHS PCT: 50 %
MCH: 25.7 pg — AB (ref 26.0–34.0)
MCHC: 32.8 g/dL (ref 30.0–36.0)
MCV: 78.5 fL (ref 78.0–100.0)
MONOS PCT: 6 %
Monocytes Absolute: 0.5 10*3/uL (ref 0.1–1.0)
Neutro Abs: 3.5 10*3/uL (ref 1.7–7.7)
Neutrophils Relative %: 42 %
Platelets: 350 10*3/uL (ref 150–400)
RBC: 4.78 MIL/uL (ref 3.87–5.11)
RDW: 13.7 % (ref 11.5–15.5)
WBC: 8.2 10*3/uL (ref 4.0–10.5)

## 2016-05-27 NOTE — ED Provider Notes (Signed)
[facility]-EMERGENCY DEPT Provider Note   CSN: [id number] Arrival date & time: 05/26/16  2259  First Provider Contact:  First MD Initiated Contact with Patient 05/27/16 0232        History   Chief Complaint Chief Complaint  Patient presents with  . Vaginal Bleeding    HPI Angela Robles is a 25 y.o. female.  Patient presents for evaluation of vaginal bleeding every day for the past 2-3 months. She reports history of irregular periods but no episodes of this duration. She denies significant pain. No lightheadedness, near syncope, nausea, fatigue. No dysuria or bowel changes.    The history is provided by the patient. No language interpreter was used.  Vaginal Bleeding  Primary symptoms include vaginal bleeding.  Primary symptoms include no discharge, no pelvic pain, no dysuria. There has been no fever. Pertinent negatives include no abdominal pain and no light-headedness.    Past Medical History:  Diagnosis Date  . Medical history non-contributory     There are no active problems to display for this patient.   Past Surgical History:  Procedure Laterality Date  . NO PAST SURGERIES    . TONSILLECTOMY      OB History    Gravida Para Term Preterm AB Living   0             SAB TAB Ectopic Multiple Live Births                   Home Medications    Prior to Admission medications   Medication Sig Start Date End Date Taking? Authorizing Provider  Multiple Vitamins-Minerals (MULTIVITAMIN WITH MINERALS) tablet Take 1 tablet by mouth daily.   Yes Historical Provider, MD  vitamin C (ASCORBIC ACID) 500 MG tablet Take 500 mg by mouth daily.   Yes Historical Provider, MD    Family History Family History  Problem Relation Age of Onset  . Cancer Maternal Grandmother     Social History Social History  Substance Use Topics  . Smoking status: Never Smoker  . Smokeless tobacco: Never Used  . Alcohol use No     Allergies   Review of patient's allergies indicates no known  allergies.   Review of Systems Review of Systems  Constitutional: Negative for fever.  Gastrointestinal: Negative for abdominal pain.  Genitourinary: Positive for vaginal bleeding. Negative for dysuria and pelvic pain.  Musculoskeletal: Negative for back pain.  Neurological: Negative for syncope, weakness and light-headedness.     Physical Exam Updated Vital Signs BP 111/71 (BP Location: Left Arm)   Pulse 71   Temp 97.9 F (36.6 C) (Oral)   Resp 14   Ht 5\' 7"  (1.702 m)   Wt 86.6 kg   SpO2 100%   BMI 29.91 kg/m   Physical Exam  Constitutional: She is oriented to person, place, and time. She appears well-developed and well-nourished. No distress.  HENT:  Head: Normocephalic.  Eyes:  No conjunctival pallor.  Abdominal: Soft. There is no tenderness.  Musculoskeletal: Normal range of motion.  Neurological: She is alert and oriented to person, place, and time.  Psychiatric: She has a normal mood and affect.     ED Treatments / Results  Labs (all labs ordered are listed, but only abnormal results are displayed) Labs Reviewed  CBC WITH DIFFERENTIAL/PLATELET - Abnormal; Notable for the following:       Result Value   MCH 25.7 (*)    Lymphs Abs 4.2 (*)    All other components within normal  limits  BASIC METABOLIC PANEL - Abnormal; Notable for the following:    Anion gap 4 (*)    All other components within normal limits   Results for orders placed or performed during the hospital encounter of 05/26/16  CBC with Differential  Result Value Ref Range   WBC 8.2 4.0 - 10.5 K/uL   RBC 4.78 3.87 - 5.11 MIL/uL   Hemoglobin 12.3 12.0 - 15.0 g/dL   HCT 16.137.5 09.636.0 - 04.546.0 %   MCV 78.5 78.0 - 100.0 fL   MCH 25.7 (L) 26.0 - 34.0 pg   MCHC 32.8 30.0 - 36.0 g/dL   RDW 40.913.7 81.111.5 - 91.415.5 %   Platelets 350 150 - 400 K/uL   Neutrophils Relative % 42 %   Neutro Abs 3.5 1.7 - 7.7 K/uL   Lymphocytes Relative 50 %   Lymphs Abs 4.2 (H) 0.7 - 4.0 K/uL   Monocytes Relative 6 %    Monocytes Absolute 0.5 0.1 - 1.0 K/uL   Eosinophils Relative 2 %   Eosinophils Absolute 0.1 0.0 - 0.7 K/uL   Basophils Relative 0 %   Basophils Absolute 0.0 0.0 - 0.1 K/uL  Basic metabolic panel  Result Value Ref Range   Sodium 135 135 - 145 mmol/L   Potassium 3.7 3.5 - 5.1 mmol/L   Chloride 103 101 - 111 mmol/L   CO2 28 22 - 32 mmol/L   Glucose, Bld 97 65 - 99 mg/dL   BUN 9 6 - 20 mg/dL   Creatinine, Ser 7.820.81 0.44 - 1.00 mg/dL   Calcium 9.0 8.9 - 95.610.3 mg/dL   GFR calc non Af Amer >60 >60 mL/min   GFR calc Af Amer >60 >60 mL/min   Anion gap 4 (L) 5 - 15   EKG  EKG Interpretation None       Radiology No results found.  Procedures Procedures (including critical care time)  Medications Ordered in ED Medications - No data to display   Initial Impression / Assessment and Plan / ED Course  I have reviewed the triage vital signs and the nursing notes.  Pertinent labs & imaging results that were available during my care of the patient were reviewed by me and considered in my medical decision making (see chart for details).  Clinical Course    Patient presents with complaint of 2-3 months of daily vaginal bleeding. No evidence of anemia (Hgb 12.3). VSS, no tachycardia. No symptoms of lightheadedness. No fever, discharge to cause concern for infection. The patient can be discharged home with GYN follow up.  Final Clinical Impressions(s) / ED Diagnoses   Final diagnoses:  None  1. Irregular vaginal bleeding.  New Prescriptions New Prescriptions   No medications on file     Elpidio AnisShari Manual Navarra, PA-C 05/27/16 0329    Geoffery Lyonsouglas Delo, MD 05/27/16 [phone]

## 2018-05-20 ENCOUNTER — Other Ambulatory Visit: Payer: Self-pay

## 2018-05-20 ENCOUNTER — Emergency Department (HOSPITAL_COMMUNITY)
Admission: EM | Admit: 2018-05-20 | Discharge: 2018-05-20 | Disposition: A | Discharge status: Home / Self Care | Payer: BLUE CROSS/BLUE SHIELD | Attending: Emergency Medicine | Admitting: Emergency Medicine

## 2018-05-20 ENCOUNTER — Encounter (HOSPITAL_COMMUNITY): Payer: Self-pay | Admitting: Emergency Medicine

## 2018-05-20 DIAGNOSIS — Z79899 Other long term (current) drug therapy: Secondary | ICD-10-CM | POA: Diagnosis not present

## 2018-05-20 DIAGNOSIS — R51 Headache: Secondary | ICD-10-CM | POA: Diagnosis not present

## 2018-05-20 DIAGNOSIS — J029 Acute pharyngitis, unspecified: Secondary | ICD-10-CM | POA: Diagnosis present

## 2018-05-20 LAB — GROUP A STREP BY PCR: Group A Strep by PCR: NOT DETECTED

## 2018-05-20 MED ORDER — DEXAMETHASONE SODIUM PHOSPHATE 10 MG/ML IJ SOLN
10.0000 mg | Freq: Once | INTRAMUSCULAR | Status: AC
  Administered 2018-05-20: 10 mg via INTRAMUSCULAR
  Filled 2018-05-20: qty 1

## 2018-05-20 NOTE — ED Notes (Signed)
Declined W/C at D/C and was escorted to lobby by RN. 

## 2018-05-20 NOTE — ED Provider Notes (Signed)
[facility]   CSN: [id number] Arrival date & time: 05/20/18  1825     History   Chief Complaint Chief Complaint  Patient presents with  . Sore Throat    HPI Angela PerfectShantae Schow is a 27 y.o. female with a history of tonsillectomy who presents to the emergency department with complaints of sore throat which started this morning and has been progressively worsening.  Rates pain an 8 out of 10 in severity, worse with swallowing but she is able to swallow, no alleviating factors.  She tried drinking warm tea without relief.  No medications tried.  She reports associated mild headache, gradual onset with steady progression, similar to previous headaches.  Denies fever, vomiting, dyspnea, cough, change in voice, drooling, or trismus.Denies recent tic exposures or chance of pregnancy.   HPI  Past Medical History:  Diagnosis Date  . Medical history non-contributory     There are no active problems to display for this patient.   Past Surgical History:  Procedure Laterality Date  . NO PAST SURGERIES    . TONSILLECTOMY       OB History    Gravida  0   Para      Term      Preterm      AB      Living        SAB      TAB      Ectopic      Multiple      Live Births               Home Medications    Prior to Admission medications   Medication Sig Start Date End Date Taking? Authorizing Provider  Multiple Vitamins-Minerals (MULTIVITAMIN WITH MINERALS) tablet Take 1 tablet by mouth daily.    [provider]  vitamin C (ASCORBIC ACID) 500 MG tablet Take 500 mg by mouth daily.    [provider]    Family History Family History  Problem Relation Age of Onset  . Cancer Maternal Grandmother     Social History Social History   Tobacco Use  . Smoking status: Never Smoker  . Smokeless tobacco: Never Used  Substance Use Topics  . Alcohol use: No  . Drug use: No     Allergies   Patient  has no known allergies.   Review of Systems Review of Systems  Constitutional: Negative for chills and fever.  HENT: Positive for sore throat and trouble swallowing (painful but able). Negative for congestion, drooling, ear pain, rhinorrhea and voice change.   Eyes: Negative for visual disturbance.  Respiratory: Negative for shortness of breath.   Cardiovascular: Negative for chest pain.  Gastrointestinal: Negative for vomiting.  Neurological: Positive for headaches. Negative for weakness and numbness.     Physical Exam Updated Vital Signs BP (!) 133/94 (BP Location: Right Arm)   Pulse 85   Temp 97.6 F (36.4 C) (Oral)   Resp 16   Ht 5\' 7"  (1.702 m)   Wt 104.3 kg (230 lb)   SpO2 100%   BMI 36.02 kg/m   Physical Exam  Constitutional: She appears well-developed and well-nourished.  Non-toxic appearance. No distress.  HENT:  Head: Normocephalic and atraumatic.  Right Ear: Tympanic membrane is not perforated, not erythematous, not retracted and not bulging.  Left Ear: Tympanic membrane is not perforated, not erythematous, not retracted and not bulging.  Nose: Nose normal.  Mouth/Throat: Uvula is midline. Posterior oropharyngeal erythema (Mild.)  present. No oropharyngeal exudate.  Tonsils are surgically absent.  Patient is tolerating her own secretions without difficulty.  No trismus.  No drooling.  No hot potato voice.  Submandibular compartment is soft. Symmetric posterior oropharynx.   Eyes: Pupils are equal, round, and reactive to light. Conjunctivae and EOM are normal. Right eye exhibits no discharge. Left eye exhibits no discharge.  Neck: Normal range of motion. Neck supple. No neck rigidity. No edema and no erythema present.  Cardiovascular: Normal rate and regular rhythm.  No murmur heard. Pulmonary/Chest: Breath sounds normal. No respiratory distress. She has no wheezes. She has no rales.  Abdominal: Soft. She exhibits no distension. There is no tenderness.    Lymphadenopathy:    She has cervical adenopathy.  Neurological: She is alert.  Clear speech.  CN III through XII grossly intact. Steady gait. Normal strength/sensation throughout.   Skin: Skin is warm and dry. No rash noted.  Psychiatric: She has a normal mood and affect. Her behavior is normal.  Nursing note and vitals reviewed.    ED Treatments / Results  Labs Results for orders placed or performed during the hospital encounter of 05/20/18  Group A Strep by PCR  Result Value Ref Range   Group A Strep by PCR NOT DETECTED NOT DETECTED   No results found.  EKG None  Radiology No results found.  Procedures Procedures (including critical care time)  Medications Ordered in ED Medications  dexamethasone (DECADRON) injection 10 mg (has no administration in time range)    Initial Impression / Assessment and Plan / ED Course  I have reviewed the triage vital signs and the nursing notes.  Pertinent labs & imaging results that were available during my care of the patient were reviewed by me and considered in my medical decision making (see chart for details).    Presents with complaint of sore throat.  Patient is nontoxic-appearing, vitals WNL other than elevated BP, doubt HTN emergency, patient aware of need for recheck.  Strep test is negative. Exam non concerning for PTA or RPA, there is no trismus, uvular deviation, or hot potato voice. Patient is tolerating her own secretions without difficulty, full ROM of the neck, submandibular compartment is soft. No headache red flags. Supportive IM Decadron given in the ER. At this time suspect viral, would consider mono should sxs persist > 1 week. Recommended use of Tylenol and Ibuprofen for any continued discomfort or fevers. I discussed results, treatment plan, need for PCP follow-up, and return precautions with the patient. Provided opportunity for questions, patient confirmed understanding and is in agreement with plan.    Final  Clinical Impressions(s) / ED Diagnoses   Final diagnoses:  Sore throat    ED Discharge Orders    None       Cherly Anderson, PA-C 05/20/18 2020    Charlynne Pander, MD 05/21/18 [phone]

## 2018-05-20 NOTE — Discharge Instructions (Addendum)
You were seen in the emergency department for a sore throat. Your strep test was negative. At this time we suspect this is a virus causing your symptoms. We gave you decadron in the ER- Decadron is a steroid used to treat the pain and swelling of your throat.   You should gradually feel better over the next few days. Take Tylenol and Ibuprofen per over the counter dosing for andy continued fever and/or pain. Follow up with your primary care provider in the next 1 week if you are not feeling better, if you do not have a primary care provider one is provided in your discharge instructions. Return to the emergency department for any new or worsening symptoms including but not limited to inability to open your mouth, inability to move your neck, worsening pain, change in your voice, inability to swallow your own saliva, drooling, or any other concerns.

## 2018-05-20 NOTE — ED Triage Notes (Addendum)
Pt reports sore throat that started today that has gotten worse. Pt reports difficulty swallowing. Pt reports accompanying headache and chills. Pt has had a tonsillectomy. Pt has had hx of strep throat and states this feels the same.

## 2018-11-26 ENCOUNTER — Encounter (HOSPITAL_COMMUNITY): Payer: Self-pay | Admitting: *Deleted

## 2018-11-26 ENCOUNTER — Emergency Department (HOSPITAL_COMMUNITY)
Admission: EM | Admit: 2018-11-26 | Discharge: 2018-11-26 | Disposition: A | Discharge status: Home / Self Care | Payer: BLUE CROSS/BLUE SHIELD | Attending: Emergency Medicine | Admitting: Emergency Medicine

## 2018-11-26 ENCOUNTER — Other Ambulatory Visit: Payer: Self-pay

## 2018-11-26 DIAGNOSIS — R112 Nausea with vomiting, unspecified: Secondary | ICD-10-CM | POA: Insufficient documentation

## 2018-11-26 DIAGNOSIS — R197 Diarrhea, unspecified: Secondary | ICD-10-CM | POA: Insufficient documentation

## 2018-11-26 DIAGNOSIS — R000 Tachycardia, unspecified: Secondary | ICD-10-CM | POA: Insufficient documentation

## 2018-11-26 LAB — CBC
HCT: 42.9 % (ref 36.0–46.0)
Hemoglobin: 13.2 g/dL (ref 12.0–15.0)
MCH: 24.8 pg — AB (ref 26.0–34.0)
MCHC: 30.8 g/dL (ref 30.0–36.0)
MCV: 80.6 fL (ref 80.0–100.0)
NRBC: 0 % (ref 0.0–0.2)
PLATELETS: 378 10*3/uL (ref 150–400)
RBC: 5.32 MIL/uL — AB (ref 3.87–5.11)
RDW: 13.4 % (ref 11.5–15.5)
WBC: 7.5 10*3/uL (ref 4.0–10.5)

## 2018-11-26 LAB — URINALYSIS, ROUTINE W REFLEX MICROSCOPIC
BILIRUBIN URINE: NEGATIVE
GLUCOSE, UA: NEGATIVE mg/dL
KETONES UR: NEGATIVE mg/dL
Nitrite: NEGATIVE
PH: 6 (ref 5.0–8.0)
PROTEIN: NEGATIVE mg/dL
Specific Gravity, Urine: 1.014 (ref 1.005–1.030)

## 2018-11-26 LAB — COMPREHENSIVE METABOLIC PANEL
ALT: 31 U/L (ref 0–44)
ANION GAP: 9 (ref 5–15)
AST: 29 U/L (ref 15–41)
Albumin: 3.6 g/dL (ref 3.5–5.0)
Alkaline Phosphatase: 81 U/L (ref 38–126)
BUN: 8 mg/dL (ref 6–20)
CALCIUM: 9 mg/dL (ref 8.9–10.3)
CO2: 24 mmol/L (ref 22–32)
Chloride: 105 mmol/L (ref 98–111)
Creatinine, Ser: 0.92 mg/dL (ref 0.44–1.00)
GFR calc Af Amer: >60 mL/min (ref >60)
GFR calc non Af Amer: >60 mL/min (ref >60)
Glucose, Bld: 102 mg/dL — ABNORMAL HIGH (ref 70–99)
Potassium: 4.1 mmol/L (ref 3.5–5.1)
Sodium: 138 mmol/L (ref 135–145)
TOTAL PROTEIN: 7.8 g/dL (ref 6.5–8.1)
Total Bilirubin: 0.6 mg/dL (ref 0.3–1.2)

## 2018-11-26 LAB — LIPASE, BLOOD: LIPASE: 30 U/L (ref 11–51)

## 2018-11-26 LAB — I-STAT BETA HCG BLOOD, ED (MC, WL, AP ONLY): I-stat hCG, quantitative: <5 m[IU]/mL (ref <5)

## 2018-11-26 MED ORDER — ONDANSETRON 4 MG PO TBDP: 4.0000 mg | ORAL_TABLET | Freq: Three times a day (TID) | ORAL | 0 refills | Status: DC | PRN

## 2018-11-26 MED ORDER — ONDANSETRON HCL 4 MG/2ML IJ SOLN
4.0000 mg | Freq: Once | INTRAMUSCULAR | Status: AC
  Administered 2018-11-26: 4 mg via INTRAVENOUS
  Filled 2018-11-26: qty 2

## 2018-11-26 MED ORDER — SODIUM CHLORIDE 0.9 % IV BOLUS
1000.0000 mL | Freq: Once | INTRAVENOUS | Status: AC
  Administered 2018-11-26: 1000 mL via INTRAVENOUS

## 2018-11-26 MED ORDER — SODIUM CHLORIDE 0.9% FLUSH: 3.0000 mL | Freq: Once | INTRAVENOUS | Status: DC

## 2018-11-26 NOTE — Discharge Instructions (Signed)
You were seen in the ER today for nausea, vomiting, and diarrhea. Your lab work was overall reassuring. We suspect this is due to a viral GI illness. We are sending you home with zofran to help with this- take every 8 hours for vomiting. Follow attached guidelines for diet regarding diarrhea.   We have prescribed you new medication(s) today. Discuss the medications prescribed today with your pharmacist as they can have adverse effects and interactions with your other medicines including over the counter and prescribed medications. Seek medical evaluation if you start to experience new or abnormal symptoms after taking one of these medicines, seek care immediately if you start to experience difficulty breathing, feeling of your throat closing, facial swelling, or rash as these could be indications of a more serious allergic reaction   Follow up with primary care within 3 days. Return to the ER for new or worsening symptoms including but not limited to inability to keep fluids down, blood in stool/vomit, fever, abdominal pain, or any other concerns.

## 2018-11-26 NOTE — ED Notes (Signed)
Pt alert and oriented in NAD. Pt verbalized understanding of discharge instructions. 

## 2018-11-26 NOTE — ED Provider Notes (Signed)
[facility]   CSN: [id number] Arrival date & time: 11/26/18  [id number]     History   Chief Complaint Chief Complaint  Patient presents with  . Emesis    HPI Ardeth PerfectShantae Robles is a 28 y.o. female without significant past medical hx who presents to the ED with complaints of N/V/D that started this AM upon waking up. Reports 4 episodes of emesis & 3-4 episodes of diarrhea each of which is non bloody, no mucous noted in the diarrhea. She states she gets some stomach cramping just prior to emesis/diarrhea, otherwise no abdominal pain. No other specific alleviating/aggravating factors. + sick contacts at work w/ similar sxs. Denies fever, chills, chest pain, dyspnea, melena, hematochezia, dysuria, or vaginal discharge/bleeding. No recent abx or foreign travel.   HPI  Past Medical History:  Diagnosis Date  . Medical history non-contributory     There are no active problems to display for this patient.   Past Surgical History:  Procedure Laterality Date  . NO PAST SURGERIES    . TONSILLECTOMY       OB History    Gravida  0   Para      Term      Preterm      AB      Living        SAB      TAB      Ectopic      Multiple      Live Births               Home Medications    Prior to Admission medications   Medication Sig Start Date End Date Taking? Authorizing Provider  Multiple Vitamins-Minerals (MULTIVITAMIN WITH MINERALS) tablet Take 1 tablet by mouth daily.    [provider]  vitamin C (ASCORBIC ACID) 500 MG tablet Take 500 mg by mouth daily.    [provider]    Family History Family History  Problem Relation Age of Onset  . Cancer Maternal Grandmother     Social History Social History   Tobacco Use  . Smoking status: Never Smoker  . Smokeless tobacco: Never Used  Substance Use Topics  . Alcohol use: No  . Drug use: No     Allergies   Patient has no known  allergies.   Review of Systems Review of Systems  Constitutional: Negative for chills and fever.  Respiratory: Negative for shortness of breath.   Cardiovascular: Negative for chest pain.  Gastrointestinal: Positive for abdominal pain (just prior to emesis/diarrhea, otherwise none), nausea and vomiting. Negative for anal bleeding, blood in stool and diarrhea.  Genitourinary: Negative for dysuria, vaginal bleeding and vaginal discharge.  All other systems reviewed and are negative.    Physical Exam Updated Vital Signs BP (!) 136/91 (BP Location: Right Arm)   Pulse (!) 112   Temp 97.7 F (36.5 C) (Oral)   Resp 18   Ht 5\' 7"  (1.702 m)   Wt 108.9 kg   SpO2 97%   BMI 37.59 kg/m   Physical Exam Vitals signs and nursing note reviewed.  Constitutional:      General: She is not in acute distress.    Appearance: She is well-developed. She is not toxic-appearing.  HENT:     Head: Normocephalic and atraumatic.  Eyes:     General:        Right eye: No discharge.        Left eye: No discharge.  Conjunctiva/sclera: Conjunctivae normal.  Neck:     Musculoskeletal: Neck supple.  Cardiovascular:     Rate and Rhythm: Regular rhythm. Tachycardia present.  Pulmonary:     Effort: Pulmonary effort is normal. No respiratory distress.     Breath sounds: Normal breath sounds. No wheezing, rhonchi or rales.  Abdominal:     General: There is no distension.     Palpations: Abdomen is soft.     Tenderness: There is no abdominal tenderness. There is no right CVA tenderness, left CVA tenderness, guarding or rebound.  Skin:    General: Skin is warm and dry.     Findings: No rash.  Neurological:     Mental Status: She is alert.     Comments: Clear speech.   Psychiatric:        Behavior: Behavior normal.      ED Treatments / Results  Labs (all labs ordered are listed, but only abnormal results are displayed) Labs Reviewed  COMPREHENSIVE METABOLIC PANEL - Abnormal; Notable for the  following components:      Result Value   Glucose, Bld 102 (*)    All other components within normal limits  CBC - Abnormal; Notable for the following components:   RBC 5.32 (*)    MCH 24.8 (*)    All other components within normal limits  LIPASE, BLOOD  URINALYSIS, ROUTINE W REFLEX MICROSCOPIC  I-STAT BETA HCG BLOOD, ED ([facility], [facility], [facility] ONLY)    EKG None  Radiology No results found.  Procedures Procedures (including critical care time)  Medications Ordered in ED Medications  sodium chloride flush (NS) 0.9 % injection 3 mL (has no administration in time range)     Initial Impression / Assessment and Plan / ED Course  I have reviewed the triage vital signs and the nursing notes.  Pertinent labs & imaging results that were available during my care of the patient were reviewed by me and considered in my medical decision making (see chart for details).   Patient presents to the ED with complaints of N/V/D. Nontoxic appearing, no apparent distress, vitals with mild tachycardia, otherwise without significant abnormality. Exam without abdominal tenderness, no peritoneal signs.    Work-up per triage reviewed:  CBC: Unremarkable. No leukocytosis/anemia.  CMP: Unremarkable. No electrolyte disturbance. LFTs/renal function WNL.  Lipase: WNL Preg test: negative Urinalysis: No obvious UTI, no urinary sxs, contaminated. Hgb present- PCP recheck.   Repeat abdominal exam remains without peritoneal signs- doubt cholecystitis, pancreatitis, perforation, obstruction, appendicitis, diverticulitis, PID, or ectopic pregnancy. Unclear definitive etiology, suspect viral GI illness w/ recent sick contacts & re-assuring work-up. No recent foreign travel or abx. Tolerating PO following zofran & feeling improved, requesting discharge home. Given fluids in the ER. Will discharge home with zofran. I discussed results, treatment plan, need for follow-up, and return precautions with the patient. Provided  opportunity for questions, patient confirmed understanding and is in agreement with plan.   Vitals:   11/26/18 1245 11/26/18 1300  BP: 127/83 133/78  Pulse: 93 91  Resp:    Temp:    SpO2: 100% 100%    Final Clinical Impressions(s) / ED Diagnoses   Final diagnoses:  Nausea vomiting and diarrhea    ED Discharge Orders         Ordered    ondansetron (ZOFRAN ODT) 4 MG disintegrating tablet  Every 8 hours PRN     11/26/18 1327           Drevon Plog R, PA-C 11/26/18 1427  Jacalyn LefevreHaviland, Julie, MD 11/26/18 1428

## 2018-11-27 LAB — URINE CULTURE

## 2020-02-21 ENCOUNTER — Ambulatory Visit: Payer: Medicaid Other | Attending: Internal Medicine

## 2020-04-07 DIAGNOSIS — R768 Other specified abnormal immunological findings in serum: Secondary | ICD-10-CM | POA: Insufficient documentation

## 2020-04-07 DIAGNOSIS — B009 Herpesviral infection, unspecified: Secondary | ICD-10-CM | POA: Insufficient documentation

## 2020-08-13 ENCOUNTER — Emergency Department (HOSPITAL_COMMUNITY)
Admission: EM | Admit: 2020-08-13 | Discharge: 2020-08-13 | Disposition: A | Discharge status: Home / Self Care | Payer: 59 | Attending: Emergency Medicine | Admitting: Emergency Medicine

## 2020-08-13 ENCOUNTER — Encounter (HOSPITAL_COMMUNITY): Payer: Self-pay

## 2020-08-13 ENCOUNTER — Other Ambulatory Visit: Payer: Self-pay

## 2020-08-13 DIAGNOSIS — N939 Abnormal uterine and vaginal bleeding, unspecified: Secondary | ICD-10-CM | POA: Diagnosis not present

## 2020-08-13 DIAGNOSIS — R1030 Lower abdominal pain, unspecified: Secondary | ICD-10-CM | POA: Diagnosis present

## 2020-08-13 LAB — CBC WITH DIFFERENTIAL/PLATELET
Abs Immature Granulocytes: 0.02 10*3/uL (ref 0.00–0.07)
Basophils Absolute: 0 10*3/uL (ref 0.0–0.1)
Basophils Relative: 0 %
Eosinophils Absolute: 0.1 10*3/uL (ref 0.0–0.5)
Eosinophils Relative: 1 %
HCT: 39.2 % (ref 36.0–46.0)
Hemoglobin: 12.2 g/dL (ref 12.0–15.0)
Immature Granulocytes: 0 %
Lymphocytes Relative: 34 %
Lymphs Abs: 2.9 10*3/uL (ref 0.7–4.0)
MCH: 25.5 pg — ABNORMAL LOW (ref 26.0–34.0)
MCHC: 31.1 g/dL (ref 30.0–36.0)
MCV: 81.8 fL (ref 80.0–100.0)
Monocytes Absolute: 0.6 10*3/uL (ref 0.1–1.0)
Monocytes Relative: 7 %
Neutro Abs: 4.9 10*3/uL (ref 1.7–7.7)
Neutrophils Relative %: 58 %
Platelets: 424 10*3/uL — ABNORMAL HIGH (ref 150–400)
RBC: 4.79 MIL/uL (ref 3.87–5.11)
RDW: 14.6 % (ref 11.5–15.5)
WBC: 8.6 10*3/uL (ref 4.0–10.5)
nRBC: 0 % (ref 0.0–0.2)

## 2020-08-13 LAB — COMPREHENSIVE METABOLIC PANEL
ALT: 87 U/L — ABNORMAL HIGH (ref 0–44)
AST: 31 U/L (ref 15–41)
Albumin: 3.5 g/dL (ref 3.5–5.0)
Alkaline Phosphatase: 76 U/L (ref 38–126)
Anion gap: 8 (ref 5–15)
BUN: 10 mg/dL (ref 6–20)
CO2: 24 mmol/L (ref 22–32)
Calcium: 8.9 mg/dL (ref 8.9–10.3)
Chloride: 107 mmol/L (ref 98–111)
Creatinine, Ser: 1.1 mg/dL — ABNORMAL HIGH (ref 0.44–1.00)
GFR, Estimated: >60 mL/min (ref >60)
Glucose, Bld: 109 mg/dL — ABNORMAL HIGH (ref 70–99)
Potassium: 4.1 mmol/L (ref 3.5–5.1)
Sodium: 139 mmol/L (ref 135–145)
Total Bilirubin: 0.4 mg/dL (ref 0.3–1.2)
Total Protein: 7.7 g/dL (ref 6.5–8.1)

## 2020-08-13 LAB — I-STAT BETA HCG BLOOD, ED (MC, WL, AP ONLY): I-stat hCG, quantitative: <5 m[IU]/mL (ref <5)

## 2020-08-13 MED ORDER — OXYCODONE-ACETAMINOPHEN 5-325 MG PO TABS
1.0000 | ORAL_TABLET | Freq: Once | ORAL | Status: AC
  Administered 2020-08-13: 1 via ORAL
  Filled 2020-08-13: qty 1

## 2020-08-13 MED ORDER — OXYCODONE-ACETAMINOPHEN 5-325 MG PO TABS
1.0000 | ORAL_TABLET | Freq: Four times a day (QID) | ORAL | 0 refills | Status: DC | PRN
Start: 2020-08-13 — End: 2022-06-20

## 2020-08-13 NOTE — ED Provider Notes (Signed)
[facility]-EMERGENCY DEPT Provider Note   CSN: [id number] Arrival date & time: 08/13/20  0547     History Chief Complaint  Patient presents with  . Vaginal Bleeding    Angela Robles is a 29 y.o. female G0, with past medical history of uterine polyp, presenting to the emergency department with complaint of persistent vaginal bleeding and lower abdominal pain.  She is followed by Dr. Bennett Scrape with [facility].  She is currently on Aygestin for uterine bleeding thought to be associated with the polyp.  She has had bleeding for the last 2 months, it is somewhat worsening, not improved with recent dose increase of Aygestin to 10 mg twice daily from 5 mg twice daily.  She states last night she began having worsening lower abdominal pain that is not improved with 800 mg of ibuprofen and Tylenol.  She states she bleeds through about 3 pads every time she needs to use the restroom she needs to change them. No other changes in discharge. States she had STD swabs done during last OB visit on the 11th, has not been sexually active since. She denies fevers or chills.  She is scheduled for hysteroscopy on November 15.  She states she messaged her provider on Friday about her worsening symptoms, however had no response therefore presents for further evaluation.  Not on anticoagulation.  The history is provided by the patient and medical records.       Past Medical History:  Diagnosis Date  . Medical history non-contributory     There are no problems to display for this patient.   Past Surgical History:  Procedure Laterality Date  . NO PAST SURGERIES    . TONSILLECTOMY       OB History    Gravida  0   Para      Term      Preterm      AB      Living        SAB      TAB      Ectopic      Multiple      Live Births              Family History  Problem Relation Age of Onset  . Cancer Maternal Grandmother     Social History   Tobacco Use  .  Smoking status: Never Smoker  . Smokeless tobacco: Never Used  Vaping Use  . Vaping Use: Never used  Substance Use Topics  . Alcohol use: No  . Drug use: No    Home Medications Prior to Admission medications   Medication Sig Start Date End Date Taking? Authorizing Provider  Multiple Vitamins-Minerals (MULTIVITAMIN WITH MINERALS) tablet Take 1 tablet by mouth daily.    [provider]  ondansetron (ZOFRAN ODT) 4 MG disintegrating tablet Take 1 tablet (4 mg total) by mouth every 8 (eight) hours as needed for nausea or vomiting. 11/26/18   Petrucelli, Lelon Mast R, PA-C  oxyCODONE-acetaminophen (PERCOCET/ROXICET) 5-325 MG tablet Take 1-2 tablets by mouth every 6 (six) hours as needed for severe pain. 08/13/20   Marcus Schwandt, [place] N, PA-C  TRI-SPRINTEC 0.18/0.215/0.25 MG-35 MCG tablet Take 1 tablet by mouth daily. 11/12/18   [provider]    Allergies    Patient has no known allergies.  Review of Systems   Review of Systems  Gastrointestinal: Positive for abdominal pain.  Genitourinary: Positive for vaginal bleeding.  All other systems reviewed and are negative.   Physical  Exam Updated Vital Signs BP 125/84 (BP Location: Left Arm)   Pulse 88   Temp 98 F (36.7 C) (Oral)   Resp 18   Ht 5\' 7"  (1.702 m)   Wt 108.9 kg   SpO2 100%   BMI 37.59 kg/m   Physical Exam Vitals and nursing note reviewed.  Constitutional:      Appearance: She is well-developed.  HENT:     Head: Normocephalic and atraumatic.  Eyes:     Conjunctiva/sclera: Conjunctivae normal.  Cardiovascular:     Rate and Rhythm: Normal rate and regular rhythm.  Pulmonary:     Effort: Pulmonary effort is normal. No respiratory distress.     Breath sounds: Normal breath sounds.  Abdominal:     General: Bowel sounds are normal.     Palpations: Abdomen is soft.     Tenderness: There is abdominal tenderness in the right lower quadrant, suprapubic area and left lower quadrant.     Comments: Small clots  evacuated from vagina. Slow ooze of dark red blood from cervical os. No obvious vaginal lacerations.   Skin:    General: Skin is warm.  Neurological:     Mental Status: She is alert.  Psychiatric:        Behavior: Behavior normal.     ED Results / Procedures / Treatments   Labs (all labs ordered are listed, but only abnormal results are displayed) Labs Reviewed  CBC WITH DIFFERENTIAL/PLATELET - Abnormal; Notable for the following components:      Result Value   MCH 25.5 (*)    Platelets 424 (*)    All other components within normal limits  COMPREHENSIVE METABOLIC PANEL - Abnormal; Notable for the following components:   Glucose, Bld 109 (*)    Creatinine, Ser 1.10 (*)    ALT 87 (*)    All other components within normal limits  I-STAT BETA HCG BLOOD, ED ([facility], [facility], [facility] ONLY)    EKG None  Radiology No results found.  Procedures Procedures (including critical care time)  Medications Ordered in ED Medications  oxyCODONE-acetaminophen (PERCOCET/ROXICET) 5-325 MG per tablet 1 tablet (1 tablet Oral Given 08/13/20 08/15/20)    ED Course  I have reviewed the triage vital signs and the nursing notes.  Pertinent labs & imaging results that were available during my care of the patient were reviewed by me and considered in my medical decision making (see chart for details).  Clinical Course as of Aug 13 941  10-10-2000 Aug 13, 2020  Aug 15, 2020 Consulted with Dr. [id number], [facility], recommends pain management and discharge with follow up tomorrow.   [JR]  0935 Pt with complete resolution in pain. Discussed recommendations per GYN. Agreeable with plan.    [JR]    Clinical Course User Index [JR] Shelaine Frie, [facility] N, PA-C   MDM Rules/Calculators/A&P                          Pt is G0, presenting with ongoing dysfunctional uterine bleeding thought to be associated with uterine polyps and worsening lower pelvic pain.  She is followed by Dr. [place] with [facility], plan for hysteroscopy on  November 15.  She is afebrile, hemodynamically stable.  Hemoglobin is 12.2, hematocrit 39.2, no leukocytosis.  Beta-hCG is negative.  Pelvic exam with slow ooze of dark red blood.  No brisk bleeding.  Pain treated.  Consulted with Dr. November 17, agrees with plan for pain management, recommends continue dose of Aygestin  at 10 mg twice daily, call the clinic in the morning for close outpatient follow-up.  Appreciate consult.  Will prescribe pain medication as ibuprofen and tylenol are not providing adequate relief.   [place] Controlled Substance reporting System queried  Final Clinical Impression(s) / ED Diagnoses Final diagnoses:  Vaginal bleeding    Rx / DC Orders ED Discharge Orders         Ordered    oxyCODONE-acetaminophen (PERCOCET/ROXICET) 5-325 MG tablet  Every 6 hours PRN        08/13/20 0937           Elianne Gubser, [place] N, PA-C 08/13/20 0175    Sabas Sous, MD 08/13/20 [phone]

## 2020-08-13 NOTE — ED Notes (Signed)
Pt in bed resting, family at bedside, respirations even and unlabored. Pt states pain meds have helped.

## 2020-08-13 NOTE — Discharge Instructions (Addendum)
You can take the oxycodone every 6 hours as needed for more severe pain.  Be aware this medication can make you drowsy, do not drive or drink alcohol with it.  Please also be aware that there is 325 mg of Tylenol per tab of hydrocodone. Call your gynecologist tomorrow morning for close outpatient follow-up.

## 2020-08-13 NOTE — ED Triage Notes (Signed)
Pt sts severe lower abdominal pain and vaginal bleeding going on for months. Pt supposed to have procedure Monday to remove "oversized polyp".

## 2020-08-28 DIAGNOSIS — R9389 Abnormal findings on diagnostic imaging of other specified body structures: Secondary | ICD-10-CM | POA: Insufficient documentation

## 2021-02-02 ENCOUNTER — Ambulatory Visit: Payer: 59 | Admitting: Podiatry

## 2021-02-09 ENCOUNTER — Ambulatory Visit: Payer: 59 | Admitting: Podiatry

## 2021-02-14 ENCOUNTER — Ambulatory Visit (INDEPENDENT_AMBULATORY_CARE_PROVIDER_SITE_OTHER): Payer: 59 | Admitting: Podiatry

## 2021-02-14 ENCOUNTER — Other Ambulatory Visit: Payer: Self-pay

## 2021-02-14 DIAGNOSIS — L600 Ingrowing nail: Secondary | ICD-10-CM | POA: Diagnosis not present

## 2021-02-16 ENCOUNTER — Encounter: Payer: Self-pay | Admitting: Podiatry

## 2021-02-16 NOTE — Progress Notes (Signed)
  Subjective:  Patient ID: Angela Robles, female    DOB: 1990/10/22,  MRN: [id number]  Chief Complaint  Patient presents with  . Ingrown Toenail    Bilateral ingrown     30 y.o. female presents with the above complaint.  Patient presents with bilateral hallux medial border ingrown.  Patient states is painful to touch is digging into the skin and is hurting with ambulation.  She has not seen anyone else prior to seeing me.  She is tried some self debridement which has helped some but not that much.  She would like to discuss treatment options for this.  She denies any other acute complaints.  She would like to have it removed.   Review of Systems: Negative except as noted in the HPI. Denies N/V/F/Ch.  Past Medical History:  Diagnosis Date  . Medical history non-contributory     Current Outpatient Medications:  Marland Kitchen  Multiple Vitamins-Minerals (MULTIVITAMIN WITH MINERALS) tablet, Take 1 tablet by mouth daily., Disp: , Rfl:  .  ondansetron (ZOFRAN ODT) 4 MG disintegrating tablet, Take 1 tablet (4 mg total) by mouth every 8 (eight) hours as needed for nausea or vomiting., Disp: 5 tablet, Rfl: 0 .  oxyCODONE-acetaminophen (PERCOCET/ROXICET) 5-325 MG tablet, Take 1-2 tablets by mouth every 6 (six) hours as needed for severe pain., Disp: 10 tablet, Rfl: 0 .  TRI-SPRINTEC 0.18/0.215/0.25 MG-35 MCG tablet, Take 1 tablet by mouth daily., Disp: , Rfl:   Social History   Tobacco Use  Smoking Status Never Smoker  Smokeless Tobacco Never Used    No Known Allergies Objective:  There were no vitals filed for this visit. There is no height or weight on file to calculate BMI. Constitutional Well developed. Well nourished.  Vascular Dorsalis pedis pulses palpable bilaterally. Posterior tibial pulses palpable bilaterally. Capillary refill normal to all digits.  No cyanosis or clubbing noted. Pedal hair growth normal.  Neurologic Normal speech. Oriented to person, place, and time. Epicritic  sensation to light touch grossly present bilaterally.  Dermatologic Painful ingrowing nail at medial nail borders of the hallux nail bilaterally. No other open wounds. No skin lesions.  Orthopedic: Normal joint ROM without pain or crepitus bilaterally. No visible deformities. No bony tenderness.   Radiographs: None Assessment:   1. Ingrown toenail of right foot   2. Ingrown left big toenail    Plan:  Patient was evaluated and treated and all questions answered.  Ingrown Nail, bilaterally -Patient elects to proceed with minor surgery to remove ingrown toenail removal today. Consent reviewed and signed by patient. -Ingrown nail excised. See procedure note. -Educated on post-procedure care including soaking. Written instructions provided and reviewed. -Patient to follow up in 2 weeks for nail check.  Procedure: Excision of Ingrown Toenail Location: Bilateral 1st toe medial nail borders. Anesthesia: Lidocaine 1% plain; 1.5 mL and Marcaine 0.5% plain; 1.5 mL, digital block. Skin Prep: Betadine. Dressing: Silvadene; telfa; dry, sterile, compression dressing. Technique: Following skin prep, the toe was exsanguinated and a tourniquet was secured at the base of the toe. The affected nail border was freed, split with a nail splitter, and excised. Chemical matrixectomy was then performed with phenol and irrigated out with alcohol. The tourniquet was then removed and sterile dressing applied. Disposition: Patient tolerated procedure well. Patient to return in 2 weeks for follow-up.   No follow-ups on file.

## 2021-03-13 ENCOUNTER — Ambulatory Visit: Payer: 59 | Admitting: Podiatry

## 2021-03-14 ENCOUNTER — Ambulatory Visit: Payer: 59 | Admitting: Podiatry

## 2021-03-16 ENCOUNTER — Ambulatory Visit (INDEPENDENT_AMBULATORY_CARE_PROVIDER_SITE_OTHER): Payer: 59 | Admitting: Podiatry

## 2021-03-16 ENCOUNTER — Other Ambulatory Visit: Payer: Self-pay

## 2021-03-16 DIAGNOSIS — L03032 Cellulitis of left toe: Secondary | ICD-10-CM

## 2021-03-16 DIAGNOSIS — L600 Ingrowing nail: Secondary | ICD-10-CM | POA: Diagnosis not present

## 2021-03-16 MED ORDER — DOXYCYCLINE HYCLATE 100 MG PO TABS: 100.0000 mg | ORAL_TABLET | Freq: Two times a day (BID) | ORAL | 0 refills | Status: DC

## 2021-03-16 MED ORDER — CLOTRIMAZOLE-BETAMETHASONE 1-0.05 % EX CREA: 1.0000 "application " | TOPICAL_CREAM | Freq: Two times a day (BID) | CUTANEOUS | 0 refills | Status: DC

## 2021-03-20 ENCOUNTER — Encounter: Payer: Self-pay | Admitting: Podiatry

## 2021-03-20 NOTE — Progress Notes (Signed)
  Subjective:  Patient ID: Angela Robles, female    DOB: [birth year],  MRN: [id number]  Chief Complaint  Patient presents with  . Ingrown Toenail    Left hallux nail check     30 y.o. female presents with the above complaint.  Patient presents for follow-up of bilateral partial nail avulsion.  She states the right side is doing well has completely had the left side is giving a little bit of problem.  She states that it is still a little bit painful especially with pressure.  She said there is some drainage that also observed.  She would like to discuss treatment options for.  There is some redness present with it.  She does not think the nail has grown back.  She denies any other acute complaints.  She does wear spacers toes.   Review of Systems: Negative except as noted in the HPI. Denies N/V/F/Ch.  Past Medical History:  Diagnosis Date  . Medical history non-contributory     Current Outpatient Medications:  .  clotrimazole-betamethasone (LOTRISONE) cream, Apply 1 application topically 2 (two) times daily., Disp: 30 g, Rfl: 0 .  doxycycline (VIBRA-TABS) 100 MG tablet, Take 1 tablet (100 mg total) by mouth 2 (two) times daily., Disp: 28 tablet, Rfl: 0 .  Multiple Vitamins-Minerals (MULTIVITAMIN WITH MINERALS) tablet, Take 1 tablet by mouth daily., Disp: , Rfl:  .  ondansetron (ZOFRAN ODT) 4 MG disintegrating tablet, Take 1 tablet (4 mg total) by mouth every 8 (eight) hours as needed for nausea or vomiting., Disp: 5 tablet, Rfl: 0 .  oxyCODONE-acetaminophen (PERCOCET/ROXICET) 5-325 MG tablet, Take 1-2 tablets by mouth every 6 (six) hours as needed for severe pain., Disp: 10 tablet, Rfl: 0 .  TRI-SPRINTEC 0.18/0.215/0.25 MG-35 MCG tablet, Take 1 tablet by mouth daily., Disp: , Rfl:   Social History   Tobacco Use  Smoking Status Never Smoker  Smokeless Tobacco Never Used    No Known Allergies Objective:  There were no vitals filed for this visit. There is no height or weight on file  to calculate BMI. Constitutional Well developed. Well nourished.  Vascular Dorsalis pedis pulses palpable bilaterally. Posterior tibial pulses palpable bilaterally. Capillary refill normal to all digits.  No cyanosis or clubbing noted. Pedal hair growth normal.  Neurologic Normal speech. Oriented to person, place, and time. Epicritic sensation to light touch grossly present bilaterally.  Dermatologic  paronychia without purulent drainage noted to left medial ingrown.  Mild pain on palpation to the ingrown.  No regrowth of the nail noted at this time  Orthopedic: Normal joint ROM without pain or crepitus bilaterally. No visible deformities. No bony tenderness.   Radiographs: None Assessment:   1. Paronychia of toe of left foot due to ingrown toenail    Plan:  Patient was evaluated and treated and all questions answered.  Left hallux medial border paronychia -I explained the patient the etiology of paronychia and various treatment options were extensively discussed.  Given that there is redness present without any sign of regrowth of the nail I believe patient would benefit from antibiotics alone.  Patient agrees with plan like to obtain antibiotics -Doxycycline was sent to the pharmacy  No follow-ups on file.

## 2021-04-06 ENCOUNTER — Ambulatory Visit: Payer: 59 | Admitting: Podiatry

## 2021-04-13 ENCOUNTER — Ambulatory Visit: Payer: 59 | Admitting: Podiatry

## 2021-11-02 ENCOUNTER — Other Ambulatory Visit: Payer: Self-pay

## 2021-11-02 ENCOUNTER — Emergency Department (HOSPITAL_BASED_OUTPATIENT_CLINIC_OR_DEPARTMENT_OTHER)
Admission: EM | Admit: 2021-11-02 | Discharge: 2021-11-02 | Disposition: A | Discharge status: Home / Self Care | Payer: Medicaid Other | Attending: Emergency Medicine | Admitting: Emergency Medicine

## 2021-11-02 ENCOUNTER — Emergency Department (HOSPITAL_BASED_OUTPATIENT_CLINIC_OR_DEPARTMENT_OTHER): Payer: Medicaid Other | Admitting: Radiology

## 2021-11-02 ENCOUNTER — Encounter (HOSPITAL_BASED_OUTPATIENT_CLINIC_OR_DEPARTMENT_OTHER): Payer: Self-pay

## 2021-11-02 DIAGNOSIS — M67432 Ganglion, left wrist: Secondary | ICD-10-CM | POA: Insufficient documentation

## 2021-11-02 MED ORDER — IBUPROFEN 800 MG PO TABS: 800.0000 mg | ORAL_TABLET | Freq: Three times a day (TID) | ORAL | 0 refills | Status: DC

## 2021-11-02 NOTE — Discharge Instructions (Addendum)
I suspect you have a ganglion cyst.  Sometimes these resolve on their own, take anti-inflammatory medicine to help alleviate the inflammation.  He can take 800 of ibuprofen 3 times daily.  Follow-up with orthopedic, information is provided above.

## 2021-11-02 NOTE — ED Provider Notes (Signed)
[facility] EMERGENCY DEPT Provider Note   CSN: [id number] Arrival date & time: 11/02/21  1325     History  Chief Complaint  Patient presents with   Wrist Pain    Angela Robles is a 31 y.o. female.   Wrist Pain  Patient presents with cyst to the left wrist on the radial side.  Noticed it 2 or 3 days ago, the sizes stayed the same.  No surrounding erythema, no discharge.  Not significantly painful, somewhat tender when she pushes on it.  No history of the same but family history of ganglion cyst.  Denies any falls or injuries, no paresthesias.  She is right-handed.  Home Medications Prior to Admission medications   Medication Sig Start Date End Date Taking? Authorizing Provider  ibuprofen (ADVIL) 800 MG tablet Take 1 tablet (800 mg total) by mouth 3 (three) times daily. 11/02/21  Yes Theron Arista, PA-C  clotrimazole-betamethasone (LOTRISONE) cream Apply 1 application topically 2 (two) times daily. 03/16/21   Candelaria Stagers, DPM  doxycycline (VIBRA-TABS) 100 MG tablet Take 1 tablet (100 mg total) by mouth 2 (two) times daily. 03/16/21   Candelaria Stagers, DPM  Multiple Vitamins-Minerals (MULTIVITAMIN WITH MINERALS) tablet Take 1 tablet by mouth daily.    [provider]  ondansetron (ZOFRAN ODT) 4 MG disintegrating tablet Take 1 tablet (4 mg total) by mouth every 8 (eight) hours as needed for nausea or vomiting. 11/26/18   Petrucelli, Lelon Mast R, PA-C  oxyCODONE-acetaminophen (PERCOCET/ROXICET) 5-325 MG tablet Take 1-2 tablets by mouth every 6 (six) hours as needed for severe pain. 08/13/20   Robinson, [place] N, PA-C  TRI-SPRINTEC 0.18/0.215/0.25 MG-35 MCG tablet Take 1 tablet by mouth daily. 11/12/18   [provider]      Allergies    Patient has no known allergies.    Review of Systems   Review of Systems  Physical Exam Updated Vital Signs BP (!) 132/93 (BP Location: Right Arm)    Pulse 79    Temp 98.4 F (36.9 C)    Resp 16    Ht 5\' 7"  (1.702 m)     Wt 108.9 kg    LMP 08/29/2021    SpO2 100%    BMI 37.59 kg/m  Physical Exam Vitals and nursing note reviewed. Exam conducted with a chaperone present.  Constitutional:      General: She is not in acute distress.    Appearance: Normal appearance.  HENT:     Head: Normocephalic and atraumatic.  Eyes:     General: No scleral icterus.    Extraocular Movements: Extraocular movements intact.     Pupils: Pupils are equal, round, and reactive to light.  Cardiovascular:     Pulses: Normal pulses.     Comments: Radial pulse 2+ bilaterally Musculoskeletal:        General: Tenderness present. Normal range of motion.     Comments: Slight tenderness with palpation of the cyst  Skin:    Capillary Refill: Capillary refill takes less than 2 seconds.     Coloration: Skin is not jaundiced.     Findings: No erythema.     Comments: No surrounding cellulitis.  Round, mobile cyst noted the radial dorsal aspect of the wrist.  Neurological:     Mental Status: She is alert. Mental status is at baseline.     Coordination: Coordination normal.   ED Results / Procedures / Treatments   Labs (all labs ordered are listed, but only abnormal results are displayed) Labs  Reviewed - No data to display  EKG None  Radiology DG Wrist Complete Left  Result Date: 11/02/2021 CLINICAL DATA:  Painful mass along the radial side of the left wrist EXAM: LEFT WRIST - COMPLETE 3+ VIEW COMPARISON:  None FINDINGS: No bony abnormality identified. No displacement of the pronator fat pad. There is some ridging along the midportion of the scaphoid without a distinct fracture identified. No significant abnormal calcifications identified. IMPRESSION: 1. No significant bony abnormality is identified. If symptoms persist despite conservative therapy, MRI may be warranted for further characterization. Electronically Signed   By: Gaylyn Rong M.D.   On: 11/02/2021 14:53    Procedures Procedures    Medications Ordered in  ED Medications - No data to display  ED Course/ Medical Decision Making/ A&P                           Medical Decision Making Amount and/or Complexity of Data Reviewed Radiology: ordered.  Risk Prescription drug management.   Stable vitals, no underlying fever or traumatic mechanism of injury.  She is neurovascularly intact, good range of motion good cap refill and good radial pulse.  Sensation to light touch grossly intact, soft mobile cyst noted to the radial side of the dorsal wrist.  Consistent with ganglion cyst.  Does not appear to be abscess given lack of infectious symptoms or purulent discharge.  Underlying neoplasm or lipoma less likely given the acute onset.  Additionally, it is translucent on exam.  Doubt septic joint, x-ray ordered in triage is negative for any fracture.  Doubt any tendon involvement either.  Will prescribe anti-inflammatory medicine, patient advised to follow-up with hand specialist.  Discharged in stable condition.        Final Clinical Impression(s) / ED Diagnoses Final diagnoses:  Ganglion cyst of dorsum of left wrist    Rx / DC Orders ED Discharge Orders          Ordered    ibuprofen (ADVIL) 800 MG tablet  3 times daily        11/02/21 1658              Theron Arista, [place] 11/02/21 2233    Franne Forts, DO 11/02/21 2355

## 2021-11-02 NOTE — ED Triage Notes (Signed)
Pt c/o wrist pain on the L for "a few days." Pt states she noted a lump to the radial aspect. Denies injury.

## 2022-03-27 DIAGNOSIS — E282 Polycystic ovarian syndrome: Secondary | ICD-10-CM | POA: Insufficient documentation

## 2022-05-08 ENCOUNTER — Encounter: Payer: Self-pay | Admitting: Podiatry

## 2022-05-08 ENCOUNTER — Ambulatory Visit (INDEPENDENT_AMBULATORY_CARE_PROVIDER_SITE_OTHER): Payer: PRIVATE HEALTH INSURANCE | Admitting: Podiatry

## 2022-05-08 DIAGNOSIS — B353 Tinea pedis: Secondary | ICD-10-CM | POA: Diagnosis not present

## 2022-05-08 DIAGNOSIS — L600 Ingrowing nail: Secondary | ICD-10-CM

## 2022-05-08 MED ORDER — TERBINAFINE HCL 250 MG PO TABS: 250.0000 mg | ORAL_TABLET | Freq: Every day | ORAL | 0 refills | Status: AC

## 2022-05-14 NOTE — Progress Notes (Signed)
Subjective:  Patient ID: Angela Robles, female    DOB: 13-Aug-1991,  MRN: [id number]  Chief Complaint  Patient presents with   Nail Problem    31 y.o. female presents with the above complaint.  Patient presents with bilateral hallux medial border ingrown she states it came back and is currently causing more pain.  She would like to have it done again.  She also has secondary complaint of athlete's foot to bilateral plantar feet.  She does not have any liver issues.  She would like to know if she can do oral medication.  The cream has not helped much   Review of Systems: Negative except as noted in the HPI. Denies N/V/F/Ch.  Past Medical History:  Diagnosis Date   Medical history non-contributory     Current Outpatient Medications:    Insulin Pen Needle (NOVOFINE PLUS PEN NEEDLE) 32G X 4 MM MISC, Use to inject saxenda daily., Disp: , Rfl:    Liraglutide -Weight Management (SAXENDA) 18 MG/3ML SOPN, INJECT 0.6 MG INTO THE SKIN ONCE DAILY FOR 1 WEEK, THEN INCREASE BY 0.6 MG EVERY WEEK UNTIL TAKING A MAX OF 3 MG., Disp: , Rfl:    terbinafine (LAMISIL) 250 MG tablet, Take 1 tablet (250 mg total) by mouth daily for 10 days., Disp: 10 tablet, Rfl: 0   amoxicillin (AMOXIL) 500 MG tablet, Take 500 mg by mouth 3 (three) times daily., Disp: , Rfl:    clotrimazole-betamethasone (LOTRISONE) cream, Apply 1 application topically 2 (two) times daily., Disp: 30 g, Rfl: 0   doxycycline (VIBRA-TABS) 100 MG tablet, Take 1 tablet (100 mg total) by mouth 2 (two) times daily., Disp: 28 tablet, Rfl: 0   ibuprofen (ADVIL) 800 MG tablet, Take 1 tablet (800 mg total) by mouth 3 (three) times daily., Disp: 21 tablet, Rfl: 0   medroxyPROGESTERone (PROVERA) 5 MG tablet, Take by mouth., Disp: , Rfl:    meloxicam (MOBIC) 15 MG tablet, Take 15 mg by mouth daily., Disp: , Rfl:    Multiple Vitamins-Minerals (MULTIVITAMIN WITH MINERALS) tablet, Take 1 tablet by mouth daily., Disp: , Rfl:    ondansetron (ZOFRAN ODT) 4 MG  disintegrating tablet, Take 1 tablet (4 mg total) by mouth every 8 (eight) hours as needed for nausea or vomiting., Disp: 5 tablet, Rfl: 0   oxyCODONE-acetaminophen (PERCOCET/ROXICET) 5-325 MG tablet, Take 1-2 tablets by mouth every 6 (six) hours as needed for severe pain., Disp: 10 tablet, Rfl: 0   SAXENDA 18 MG/3ML SOPN, Inject into the skin., Disp: , Rfl:    TRI-SPRINTEC 0.18/0.215/0.25 MG-35 MCG tablet, Take 1 tablet by mouth daily., Disp: , Rfl:   Social History   Tobacco Use  Smoking Status Never  Smokeless Tobacco Never    No Known Allergies Objective:  There were no vitals filed for this visit. There is no height or weight on file to calculate BMI. Constitutional Well developed. Well nourished.  Vascular Dorsalis pedis pulses palpable bilaterally. Posterior tibial pulses palpable bilaterally. Capillary refill normal to all digits.  No cyanosis or clubbing noted. Pedal hair growth normal.  Neurologic Normal speech. Oriented to person, place, and time. Epicritic sensation to light touch grossly present bilaterally.  Dermatologic Painful ingrowing nail at medial nail borders of the hallux nail bilaterally.  Skin epidermolysis noted to bilateral plantar foot. No other open wounds. No skin lesions.  Orthopedic: Normal joint ROM without pain or crepitus bilaterally. No visible deformities. No bony tenderness.   Radiographs: None Assessment:   1. Ingrown toenail of right  foot   2. Ingrown left big toenail   3. Tinea pedis of both feet     Plan:  Patient was evaluated and treated and all questions answered.  Athlete's foot bilateral -I explained patient the etiology of athlete's foot emergency room and options were discussed.  Given the amount of athlete's foot that is present patient will benefit from Lamisil.  I have sent in Lamisil for 10 days.  Advised her to complete the course.  She states understanding.  She denies any liver issues  Ingrown Nail,  bilaterally~recurrence -Patient elects to proceed with minor surgery to remove ingrown toenail removal today. Consent reviewed and signed by patient. -Ingrown nail excised. See procedure note. -Educated on post-procedure care including soaking. Written instructions provided and reviewed. -Patient to follow up in 2 weeks for nail check.  Procedure: Excision of Ingrown Toenail Location: Bilateral 1st toe medial nail borders. Anesthesia: Lidocaine 1% plain; 1.5 mL and Marcaine 0.5% plain; 1.5 mL, digital block. Skin Prep: Betadine. Dressing: Silvadene; telfa; dry, sterile, compression dressing. Technique: Following skin prep, the toe was exsanguinated and a tourniquet was secured at the base of the toe. The affected nail border was freed, split with a nail splitter, and excised. Chemical matrixectomy was then performed with phenol and irrigated out with alcohol. The tourniquet was then removed and sterile dressing applied. Disposition: Patient tolerated procedure well. Patient to return in 2 weeks for follow-up.   No follow-ups on file.

## 2022-05-24 ENCOUNTER — Ambulatory Visit: Payer: PRIVATE HEALTH INSURANCE | Admitting: Podiatry

## 2022-05-29 ENCOUNTER — Ambulatory Visit: Payer: PRIVATE HEALTH INSURANCE | Admitting: Podiatry

## 2022-06-19 ENCOUNTER — Encounter (HOSPITAL_BASED_OUTPATIENT_CLINIC_OR_DEPARTMENT_OTHER): Payer: Self-pay

## 2022-06-19 ENCOUNTER — Other Ambulatory Visit: Payer: Self-pay

## 2022-06-19 DIAGNOSIS — R112 Nausea with vomiting, unspecified: Secondary | ICD-10-CM | POA: Insufficient documentation

## 2022-06-19 LAB — LIPASE, BLOOD: Lipase: 27 U/L (ref 11–51)

## 2022-06-19 LAB — COMPREHENSIVE METABOLIC PANEL
ALT: 23 U/L (ref 0–44)
AST: 21 U/L (ref 15–41)
Albumin: 4.1 g/dL (ref 3.5–5.0)
Alkaline Phosphatase: 83 U/L (ref 38–126)
Anion gap: 8 (ref 5–15)
BUN: 12 mg/dL (ref 6–20)
CO2: 28 mmol/L (ref 22–32)
Calcium: 9.8 mg/dL (ref 8.9–10.3)
Chloride: 102 mmol/L (ref 98–111)
Creatinine, Ser: 1.05 mg/dL — ABNORMAL HIGH (ref 0.44–1.00)
GFR, Estimated: >60 mL/min (ref >60)
Glucose, Bld: 85 mg/dL (ref 70–99)
Potassium: 3.7 mmol/L (ref 3.5–5.1)
Sodium: 138 mmol/L (ref 135–145)
Total Bilirubin: 0.3 mg/dL (ref 0.3–1.2)
Total Protein: 7.7 g/dL (ref 6.5–8.1)

## 2022-06-19 LAB — CBC
HCT: 37.4 % (ref 36.0–46.0)
Hemoglobin: 12.7 g/dL (ref 12.0–15.0)
MCH: 27.3 pg (ref 26.0–34.0)
MCHC: 34 g/dL (ref 30.0–36.0)
MCV: 80.4 fL (ref 80.0–100.0)
Platelets: 367 10*3/uL (ref 150–400)
RBC: 4.65 MIL/uL (ref 3.87–5.11)
RDW: 13.5 % (ref 11.5–15.5)
WBC: 7.1 10*3/uL (ref 4.0–10.5)
nRBC: 0 % (ref 0.0–0.2)

## 2022-06-19 NOTE — ED Triage Notes (Signed)
Reports vomiting once a week for 3 weeks. Last episode was today.   Irregular menstrual cycles.

## 2022-06-20 ENCOUNTER — Emergency Department (HOSPITAL_BASED_OUTPATIENT_CLINIC_OR_DEPARTMENT_OTHER)
Admission: EM | Admit: 2022-06-20 | Discharge: 2022-06-20 | Disposition: A | Discharge status: Home / Self Care | Payer: PRIVATE HEALTH INSURANCE | Attending: Emergency Medicine | Admitting: Emergency Medicine

## 2022-06-20 ENCOUNTER — Encounter (HOSPITAL_BASED_OUTPATIENT_CLINIC_OR_DEPARTMENT_OTHER): Payer: Self-pay | Admitting: Emergency Medicine

## 2022-06-20 DIAGNOSIS — R112 Nausea with vomiting, unspecified: Secondary | ICD-10-CM

## 2022-06-20 LAB — URINALYSIS, ROUTINE W REFLEX MICROSCOPIC
Bilirubin Urine: NEGATIVE
Glucose, UA: NEGATIVE mg/dL
Hgb urine dipstick: NEGATIVE
Ketones, ur: NEGATIVE mg/dL
Leukocytes,Ua: NEGATIVE
Nitrite: NEGATIVE
Specific Gravity, Urine: 1.026 (ref 1.005–1.030)
pH: 6.5 (ref 5.0–8.0)

## 2022-06-20 LAB — PREGNANCY, URINE: Preg Test, Ur: NEGATIVE

## 2022-06-20 MED ORDER — ONDANSETRON 4 MG PO TBDP: 4.0000 mg | ORAL_TABLET | Freq: Three times a day (TID) | ORAL | 0 refills | Status: AC | PRN

## 2022-06-20 MED ORDER — ONDANSETRON 4 MG PO TBDP
4.0000 mg | ORAL_TABLET | Freq: Once | ORAL | Status: AC
  Administered 2022-06-20: 4 mg via ORAL
  Filled 2022-06-20: qty 1

## 2022-06-20 NOTE — ED Provider Notes (Signed)
[facility] EMERGENCY DEPT  Provider Note  CSN: [id number] Arrival date & time: 06/19/22 1959  History Chief Complaint  Patient presents with   Vomiting    Angela Robles is a 31 y.o. female with no significant PMH reports intermittent episodes of nausea and vomiting over the last couple of weeks. She feels fine between episodes. Occasionally has some R sided abdominal pain but not correlated with the vomiting episodes. She has not had any fever, diarrhea. Does not drink ETOH or use drugs. No NSAID use. She vomited earlier this evening but none in the last several hours.    Home Medications Prior to Admission medications   Medication Sig Start Date End Date Taking? Authorizing Provider  ondansetron (ZOFRAN-ODT) 4 MG disintegrating tablet Take 1 tablet (4 mg total) by mouth every 8 (eight) hours as needed for nausea or vomiting. 06/20/22  Yes Pollyann Savoy, MD  Insulin Pen Needle (NOVOFINE PLUS PEN NEEDLE) 32G X 4 MM MISC Use to inject saxenda daily. 04/02/22   [provider]  Liraglutide -Weight Management (SAXENDA) 18 MG/3ML SOPN INJECT 0.6 MG INTO THE SKIN ONCE DAILY FOR 1 WEEK, THEN INCREASE BY 0.6 MG EVERY WEEK UNTIL TAKING A MAX OF 3 MG. 04/15/22   [provider]  medroxyPROGESTERone (PROVERA) 5 MG tablet Take by mouth. 03/27/22   [provider]  meloxicam (MOBIC) 15 MG tablet Take 15 mg by mouth daily. 03/27/22   [provider]  Multiple Vitamins-Minerals (MULTIVITAMIN WITH MINERALS) tablet Take 1 tablet by mouth daily.    [provider]  SAXENDA 18 MG/3ML SOPN Inject into the skin. 04/17/22   [provider]  TRI-SPRINTEC 0.18/0.215/0.25 MG-35 MCG tablet Take 1 tablet by mouth daily. 11/12/18   [provider]     Allergies    Patient has no known allergies.   Review of Systems   Review of Systems Please see HPI for pertinent positives and negatives  Physical Exam BP 110/78   Pulse 83   Temp  98.6 F (37 C)   Resp 18   Ht 5\' 7"  (1.702 m)   Wt 108.9 kg   LMP  (LMP Unknown)   SpO2 97%   BMI 37.59 kg/m   Physical Exam Vitals and nursing note reviewed.  Constitutional:      Appearance: Normal appearance.  HENT:     Head: Normocephalic and atraumatic.     Nose: Nose normal.     Mouth/Throat:     Mouth: Mucous membranes are moist.  Eyes:     Extraocular Movements: Extraocular movements intact.     Conjunctiva/sclera: Conjunctivae normal.  Cardiovascular:     Rate and Rhythm: Normal rate.  Pulmonary:     Effort: Pulmonary effort is normal.     Breath sounds: Normal breath sounds.  Abdominal:     General: Abdomen is flat.     Palpations: Abdomen is soft. There is no mass.     Tenderness: There is no abdominal tenderness. There is no guarding.  Musculoskeletal:        General: No swelling. Normal range of motion.     Cervical back: Neck supple.  Skin:    General: Skin is warm and dry.  Neurological:     General: No focal deficit present.     Mental Status: She is alert.  Psychiatric:        Mood and Affect: Mood normal.     ED Results / Procedures / Treatments   EKG None  Procedures Procedures  Medications Ordered in the ED Medications  ondansetron (ZOFRAN-ODT) disintegrating tablet 4 mg (4 mg Oral Given 06/20/22 0207)    Initial Impression and Plan  Patient with intermittent vomiting of unclear etilogy. She has a benign abdomen now. Labs done in triage show normal CBC, CMP and Lipase. UA/HCG is pending. Will give Zofran ODT.   ED Course   Clinical Course as of 06/20/22 0214  Thu Jun 20, 2022  0204 HCG is neg. UA is clear. She has not vomiting in several hours and her abdomen remains benign. Recommend outpatient PCP follow up. Rx for Zofran as needed. RTED for any other concerns.  [CS]    Clinical Course User Index [CS] Pollyann Savoy, MD     MDM Rules/Calculators/A&P Medical Decision Making Problems Addressed: Nausea and vomiting,  unspecified vomiting type: acute illness or injury  Amount and/or Complexity of Data Reviewed Labs: ordered. Decision-making details documented in ED Course.  Risk Prescription drug management.    Final Clinical Impression(s) / ED Diagnoses Final diagnoses:  Nausea and vomiting, unspecified vomiting type    Rx / DC Orders ED Discharge Orders          Ordered    ondansetron (ZOFRAN-ODT) 4 MG disintegrating tablet  Every 8 hours PRN        06/20/22 0214             Pollyann Savoy, MD 06/20/22 [phone]

## 2022-06-30 IMAGING — DX DG WRIST COMPLETE 3+V*L*
4 series · 4 of 4 positions shown · non-contrast
Comparison: None

CLINICAL DATA: Painful mass along the radial side of the left wrist

EXAM:
LEFT WRIST - COMPLETE 3+ VIEW

[wrist ap]
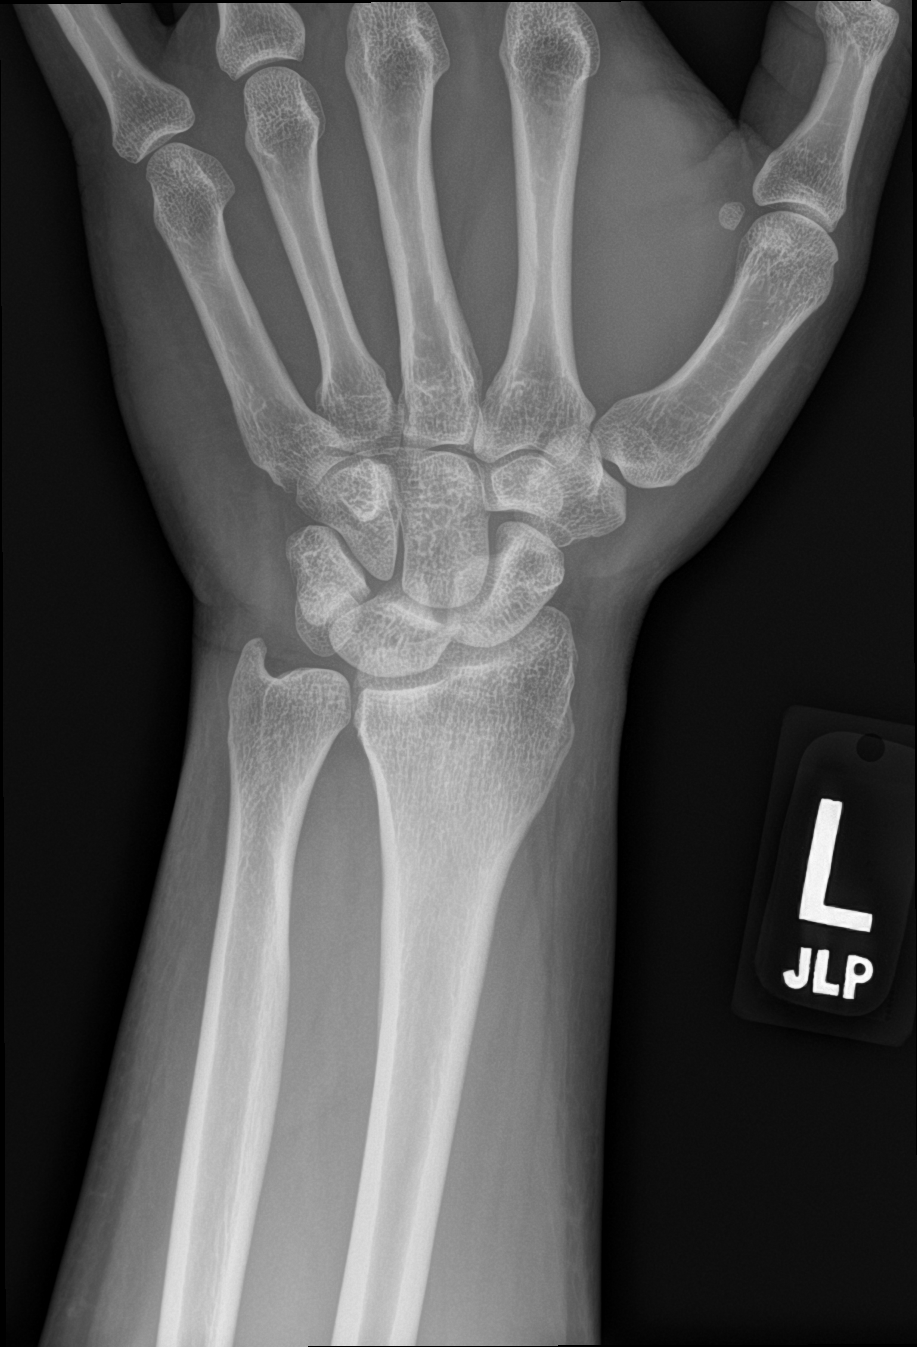

[wrist obl]
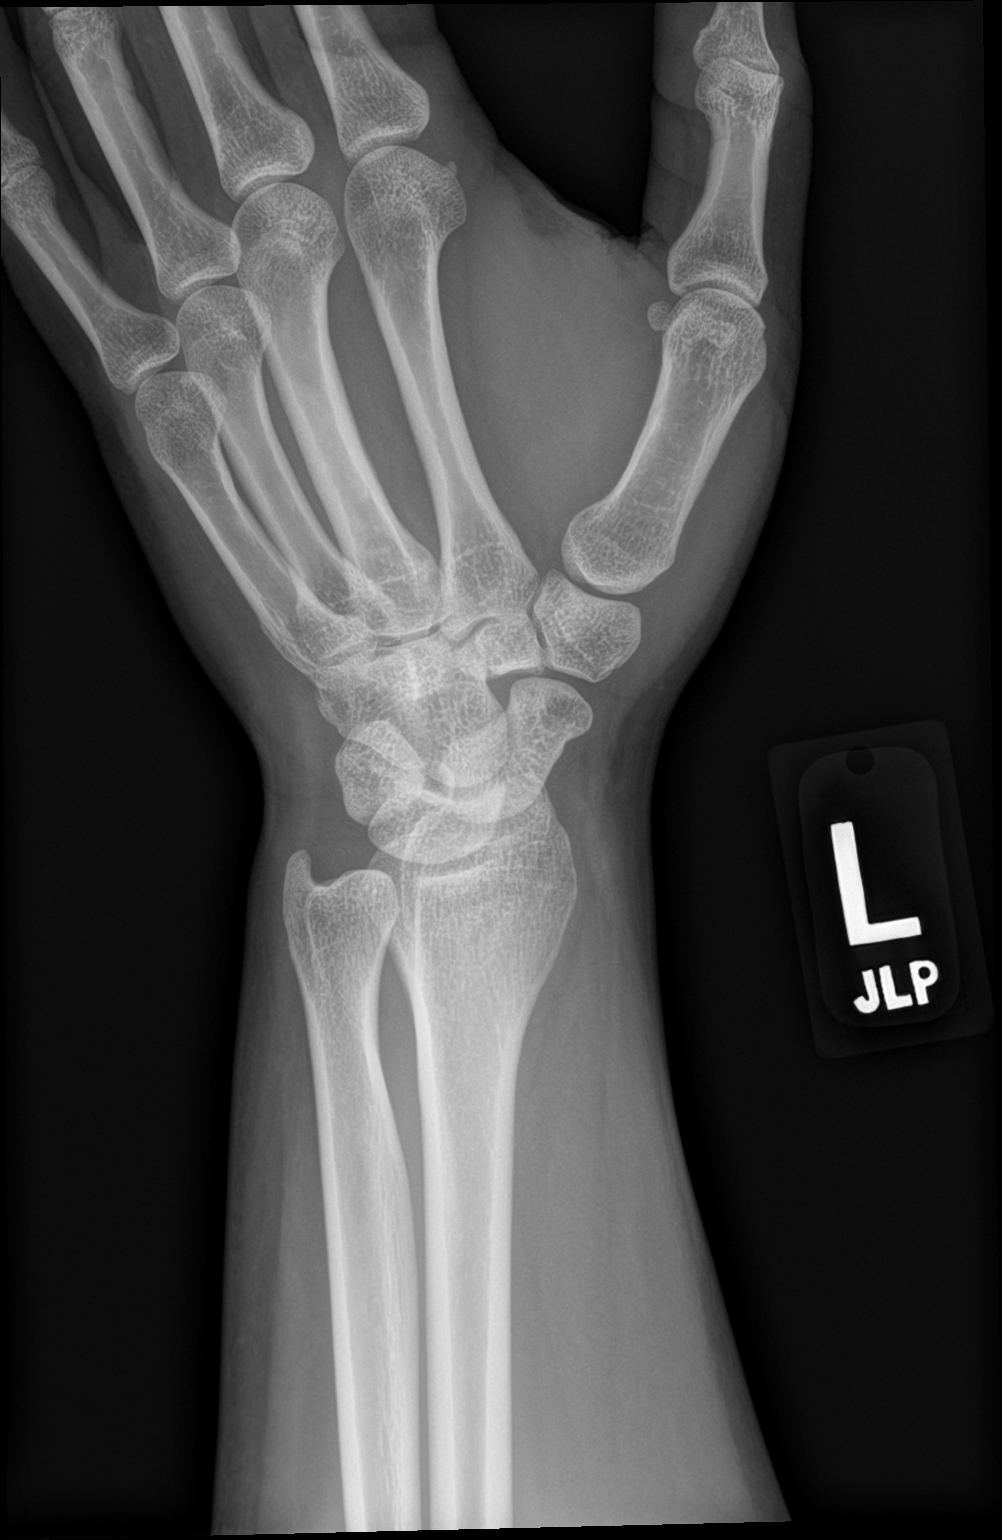

[wrist lat]
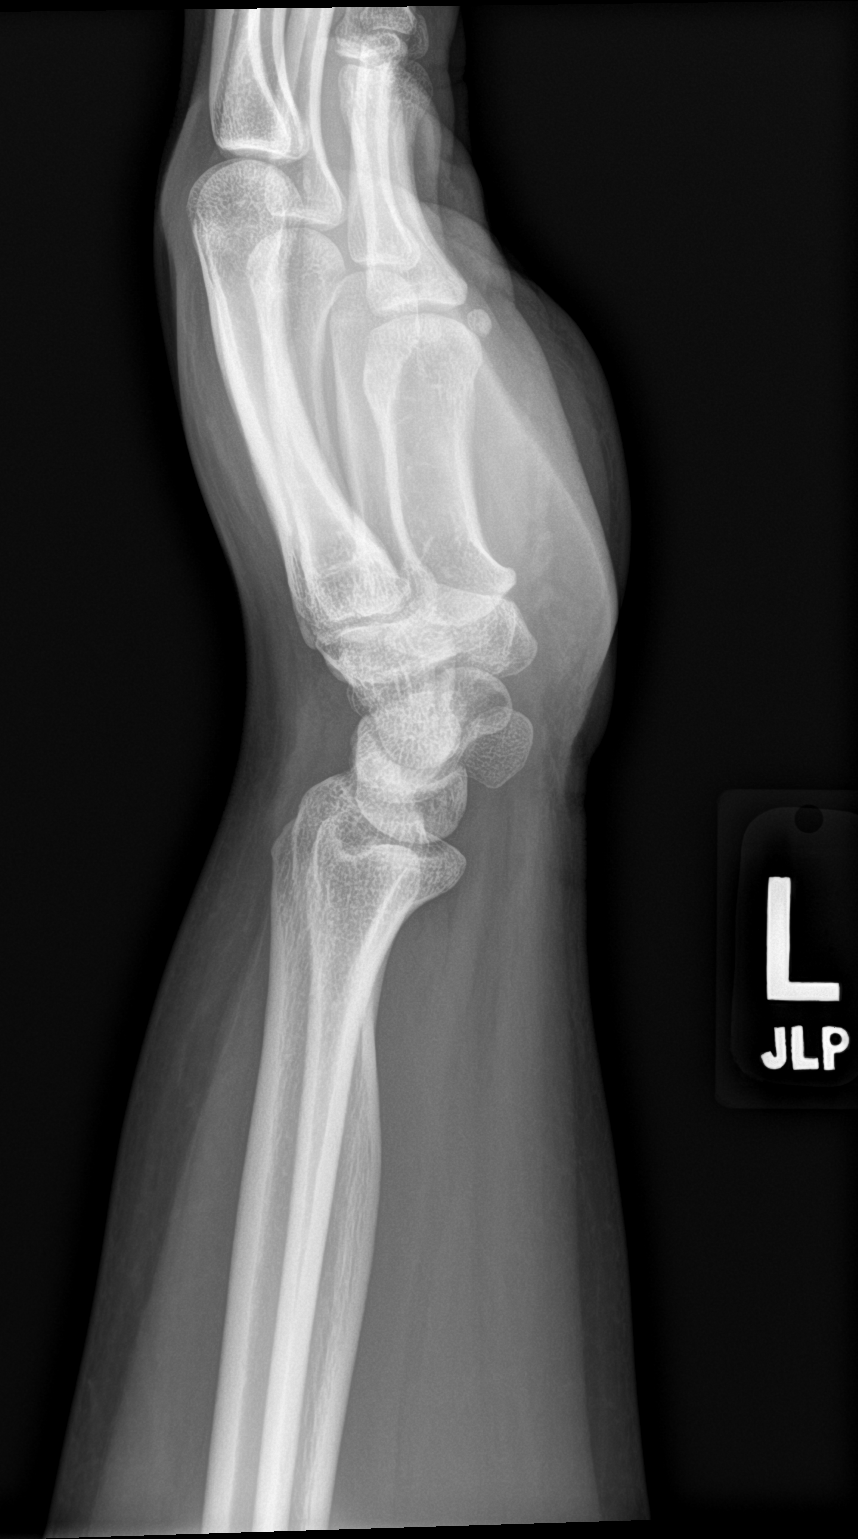

[wrist navicular]
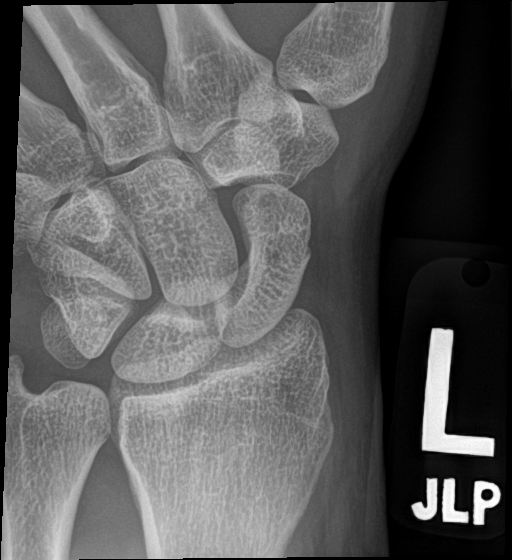

[4 of 4 positions shown; findings below may reference images not displayed]

FINDINGS: No bony abnormality identified. No displacement of the pronator fat
pad. There is some ridging along the midportion of the scaphoid
without a distinct fracture identified. No significant abnormal
calcifications identified.
IMPRESSION: 1. No significant bony abnormality is identified. If symptoms
persist despite conservative therapy, MRI may be warranted for
further characterization.

## 2023-01-01 ENCOUNTER — Ambulatory Visit: Payer: PRIVATE HEALTH INSURANCE | Admitting: Podiatry

## 2023-06-27 ENCOUNTER — Encounter (HOSPITAL_COMMUNITY): Payer: Self-pay | Admitting: *Deleted

## 2023-06-27 ENCOUNTER — Other Ambulatory Visit: Payer: Self-pay

## 2023-06-27 ENCOUNTER — Emergency Department (HOSPITAL_COMMUNITY)
Admission: EM | Admit: 2023-06-27 | Discharge: 2023-06-28 | Disposition: A | Discharge status: Home / Self Care | Payer: PRIVATE HEALTH INSURANCE | Attending: Emergency Medicine | Admitting: Emergency Medicine

## 2023-06-27 DIAGNOSIS — M5431 Sciatica, right side: Secondary | ICD-10-CM

## 2023-06-27 DIAGNOSIS — M79604 Pain in right leg: Secondary | ICD-10-CM | POA: Diagnosis present

## 2023-06-27 DIAGNOSIS — M5441 Lumbago with sciatica, right side: Secondary | ICD-10-CM | POA: Insufficient documentation

## 2023-06-27 LAB — URINALYSIS, ROUTINE W REFLEX MICROSCOPIC
Bilirubin Urine: NEGATIVE
Glucose, UA: NEGATIVE mg/dL
Ketones, ur: NEGATIVE mg/dL
Leukocytes,Ua: NEGATIVE
Nitrite: NEGATIVE
Protein, ur: NEGATIVE mg/dL
Specific Gravity, Urine: 1.02 (ref 1.005–1.030)
pH: 6.5 (ref 5.0–8.0)

## 2023-06-27 LAB — COMPREHENSIVE METABOLIC PANEL
ALT: 27 U/L (ref 0–44)
AST: 23 U/L (ref 15–41)
Albumin: 3.6 g/dL (ref 3.5–5.0)
Alkaline Phosphatase: 97 U/L (ref 38–126)
Anion gap: 13 (ref 5–15)
BUN: 8 mg/dL (ref 6–20)
CO2: 25 mmol/L (ref 22–32)
Calcium: 9.2 mg/dL (ref 8.9–10.3)
Chloride: 101 mmol/L (ref 98–111)
Creatinine, Ser: 0.99 mg/dL (ref 0.44–1.00)
GFR, Estimated: >60 mL/min (ref >60)
Glucose, Bld: 125 mg/dL — ABNORMAL HIGH (ref 70–99)
Potassium: 3.6 mmol/L (ref 3.5–5.1)
Sodium: 139 mmol/L (ref 135–145)
Total Bilirubin: 0.2 mg/dL — ABNORMAL LOW (ref 0.3–1.2)
Total Protein: 7.4 g/dL (ref 6.5–8.1)

## 2023-06-27 LAB — CBC
HCT: 42.6 % (ref 36.0–46.0)
Hemoglobin: 13.6 g/dL (ref 12.0–15.0)
MCH: 25.5 pg — ABNORMAL LOW (ref 26.0–34.0)
MCHC: 31.9 g/dL (ref 30.0–36.0)
MCV: 79.8 fL — ABNORMAL LOW (ref 80.0–100.0)
Platelets: 367 10*3/uL (ref 150–400)
RBC: 5.34 MIL/uL — ABNORMAL HIGH (ref 3.87–5.11)
RDW: 13.3 % (ref 11.5–15.5)
WBC: 8.6 10*3/uL (ref 4.0–10.5)
nRBC: 0 % (ref 0.0–0.2)

## 2023-06-27 LAB — URINALYSIS, MICROSCOPIC (REFLEX)

## 2023-06-27 LAB — LIPASE, BLOOD: Lipase: 39 U/L (ref 11–51)

## 2023-06-27 MED ORDER — OXYCODONE-ACETAMINOPHEN 5-325 MG PO TABS
2.0000 | ORAL_TABLET | Freq: Once | ORAL | Status: AC
  Administered 2023-06-27: 2 via ORAL
  Filled 2023-06-27: qty 2

## 2023-06-27 MED ORDER — KETOROLAC TROMETHAMINE 30 MG/ML IJ SOLN
30.0000 mg | Freq: Once | INTRAMUSCULAR | Status: AC
  Administered 2023-06-27: 30 mg via INTRAMUSCULAR
  Filled 2023-06-27: qty 1

## 2023-06-27 NOTE — ED Triage Notes (Signed)
Abd pain since this am with nausea  lmp irregular

## 2023-06-27 NOTE — ED Provider Notes (Signed)
[facility]EMERGENCY DEPT [facility] Emergency Department Provider Note MRN:  [id number]  Arrival date & time: 06/28/23     Chief Complaint   Abdominal Pain   History of Present Illness   Angela Robles is a 32 y.o. year-old female presents to the ED with chief complaint of right leg pain.  She states the pain starts in her right butt and runs down the back of her right leg.  She states onset was today.  She denies any injuries.  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  She denies any successful treatments prior to arrival.  She states symptoms worsen with walking.  She denies numbness or weakness.  History provided by patient.   Review of Systems  Pertinent positive and negative review of systems noted in HPI.    Physical Exam   Vitals:   06/27/23 2345 06/28/23 0044  BP: 106/70   Pulse: 97   Resp: 16   Temp:  (!) 97.5 F (36.4 C)  SpO2: 99%     CONSTITUTIONAL:  non toxic-appearing, NAD NEURO:  Alert and oriented x 3, CN 3-12 grossly intact EYES:  eyes equal and reactive ENT/NECK:  Supple, no stridor  CARDIO:  tachycardic, regular rhythm, appears well-perfused  PULM:  No respiratory distress, CTAB GI/GU:  non-distended,  MSK/SPINE:  No gross deformities, no edema, moves all extremities, mild right low back tenderness, pain with right-sided straight leg raise SKIN:  no rash, atraumatic   *Additional and/or pertinent findings included in MDM below  Diagnostic and Interventional Summary    EKG Interpretation Date/Time:    Ventricular Rate:    PR Interval:    QRS Duration:    QT Interval:    QTC Calculation:   R Axis:      Text Interpretation:         Labs Reviewed  COMPREHENSIVE METABOLIC PANEL - Abnormal; Notable for the following components:      Result Value   Glucose, Bld 125 (*)    Total Bilirubin 0.2 (*)    All other components within normal limits  CBC - Abnormal; Notable for the following components:   RBC 5.34  (*)    MCV 79.8 (*)    MCH 25.5 (*)    All other components within normal limits  URINALYSIS, ROUTINE W REFLEX MICROSCOPIC - Abnormal; Notable for the following components:   APPearance CLOUDY (*)    Hgb urine dipstick TRACE (*)    All other components within normal limits  HCG, SERUM, QUALITATIVE - Abnormal; Notable for the following components:   Preg, Serum NEGATIVE (*)    All other components within normal limits  URINALYSIS, MICROSCOPIC (REFLEX) - Abnormal; Notable for the following components:   Bacteria, UA RARE (*)    All other components within normal limits  LIPASE, BLOOD    No orders to display    Medications  ketorolac (TORADOL) 30 MG/ML injection 30 mg (30 mg Intramuscular Given 06/27/23 2337)  oxyCODONE-acetaminophen (PERCOCET/ROXICET) 5-325 MG per tablet 2 tablet (2 tablets Oral Given 06/27/23 2337)     Procedures  /  Critical Care Procedures  ED Course and Medical Decision Making  I have reviewed the triage vital signs, the nursing notes, and pertinent available records from the EMR.  Social Determinants Affecting Complexity of Care: Patient has no clinically significant social determinants affecting this chief complaint..   ED Course:    Medical Decision Making Here with right-sided low back pain that radiates  into her buttock and down her right leg.  Symptoms seem most consistent with sciatica.  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  She states the pain is worsened with movement and palpation.  She denies any successful treatments prior to arrival.  Labs ordered in triage are reassuring.  Pregnancy test negative.  Urinalysis is not consistent with UTI.  Doubt kidney stone.  No significant electrolyte derangement.  No leukocytosis or anemia.  Patient feels improved after Toradol and Percocet.  Will treat at home with prednisone and Flexeril.  Recommend outpatient sports medicine/orthopedics  follow-up.  Return precautions discussed.  Risk Prescription drug management.         Consultants: No consultations were needed in caring for this patient.   Treatment and Plan: Emergency department workup does not suggest an emergent condition requiring admission or immediate intervention beyond  what has been performed at this time. The patient is safe for discharge and has  been instructed to return immediately for worsening symptoms, change in  symptoms or any other concerns    Final Clinical Impressions(s) / ED Diagnoses     ICD-10-CM   1. Sciatica of right side  M54.31       ED Discharge Orders          Ordered    predniSONE (DELTASONE) 20 MG tablet  Daily        06/28/23 0117    cyclobenzaprine (FLEXERIL) 10 MG tablet  3 times daily PRN        06/28/23 0117              Discharge Instructions Discussed with and Provided to Patient:   Discharge Instructions   None      Roxy Horseman, PA-C 06/28/23 0120    Glynn Octave, MD 06/28/23 [phone]

## 2023-06-28 LAB — HCG, SERUM, QUALITATIVE: Preg, Serum: NEGATIVE

## 2023-06-28 MED ORDER — PREDNISONE 20 MG PO TABS
40.0000 mg | ORAL_TABLET | Freq: Every day | ORAL | 0 refills | Status: AC
Start: 2023-06-28 — End: ?

## 2023-06-28 MED ORDER — CYCLOBENZAPRINE HCL 10 MG PO TABS
10.0000 mg | ORAL_TABLET | Freq: Three times a day (TID) | ORAL | 0 refills | Status: AC | PRN
Start: 2023-06-28 — End: ?

## 2023-06-28 NOTE — ED Notes (Signed)
Pt reports pain has improved. Pt states she feels well enough for discharge.

## 2023-06-28 NOTE — ED Notes (Signed)
Pt in  NAD at d/c from ED. A&O. Ambulatory.  Respirations even & unlabored. Skin warm & dry.Pain improved. Pt verbalized understanding of d/c teaching including follow up care, medications, pain management and reasons to return to the ED. No needs or questions expressed at d/c.

## 2023-12-01 ENCOUNTER — Telehealth: Payer: Self-pay | Admitting: Podiatry

## 2023-12-01 ENCOUNTER — Encounter: Payer: Self-pay | Admitting: Podiatry

## 2023-12-01 ENCOUNTER — Ambulatory Visit (INDEPENDENT_AMBULATORY_CARE_PROVIDER_SITE_OTHER): Payer: PRIVATE HEALTH INSURANCE | Admitting: Podiatry

## 2023-12-01 DIAGNOSIS — L600 Ingrowing nail: Secondary | ICD-10-CM

## 2023-12-01 NOTE — Patient Instructions (Signed)

## 2023-12-01 NOTE — Telephone Encounter (Signed)
Patient is stating she was seen this morning and now is experiencing a lot of pain. She is requesting to speak with provider or nurse. (R22 bil ingrown toenail on both big toes foot fungus) Contact telephone number, [phone]

## 2023-12-01 NOTE — Progress Notes (Signed)
  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    33 y.o. female presents for concern as above. She has seen Dr. Allena Katz in the past and has had permanent procedure but the nails keep growing back in. Relates they have been very painful. She believes she needs another procedure today . Denies any other pedal complaints. Denies n/v/f/c.   Past Medical History:  Diagnosis Date   Medical history non-contributory     Objective:  Physical Exam: Vascular: DP/PT pulses 2/4 bilateral. CFT <3 seconds. Normal hair growth on digits. No edema.  Skin. No lacerations or abrasions bilateral feet. Incurvaiton of bilateral borders of bilateral great toenails with tenderness present. No erythema edema or purulence noted.  Musculoskeletal: MMT 5/5 bilateral lower extremities in DF, PF, Inversion and Eversion. Deceased ROM in DF of ankle joint.  Neurological: Sensation intact to light touch.   Assessment:   1. Ingrown toenail of right foot   2. Ingrown left greater toenail      Plan:  Patient was evaluated and treated and all questions answered. Discussed ingrown toenails etiology and treatment options including procedure for removal vs conservative care.  Patient requesting removal of ingrown nail today. Procedure below.  Discussed procedure and post procedure care and patient expressed understanding.  Will follow-up in 2 weeks for nail check or sooner if any problems arise.    Procedure:  Procedure: partial Nail Avulsion of bilaterally hallux bilatearl nail border.  Surgeon: Louann Sjogren, DPM  Pre-op Dx: Ingrown toenail without infection Post-op: Same  Place of Surgery: Office exam room.  Indications for surgery: Painful and ingrown toenail.    The  patient is requesting removal of nail with  chemical matrixectomy. Risks and complications were discussed with the patient for which they understand and written consent was obtained. Under sterile conditions a total of 3 mL of  1% lidocaine plain was infiltrated in a hallux block fashion. Once anesthetized, the skin was prepped in sterile fashion. A tourniquet was then applied. Next the bilateral aspect of hallux nail border was then sharply excised making sure to remove the entire offending nail border.  Next phenol was then applied under standard conditions to permanently destroy the matrix and copiously irrigated. Silvadene was applied. A dry sterile dressing was applied. After application of the dressing the tourniquet was removed and there is found to be an immediate capillary refill time to the digit. The patient tolerated the procedure well without any complications. Procedure repeated on contralateral side. Post procedure instructions were discussed the patient for which he verbally understood. Follow-up in two weeks for nail check or sooner if any problems are to arise. Discussed signs/symptoms of infection and directed to call the office immediately should any occur or go directly to the emergency room. In the meantime, encouraged to call the office with any questions, concerns, changes symptoms.   Louann Sjogren, DPM

## 2023-12-02 ENCOUNTER — Other Ambulatory Visit: Payer: Self-pay

## 2023-12-02 ENCOUNTER — Emergency Department (HOSPITAL_COMMUNITY)
Admission: EM | Admit: 2023-12-02 | Discharge: 2023-12-03 | Discharge status: Against Medical Advice | Payer: Medicaid Other | Attending: Emergency Medicine | Admitting: Emergency Medicine

## 2023-12-02 DIAGNOSIS — Z5321 Procedure and treatment not carried out due to patient leaving prior to being seen by health care provider: Secondary | ICD-10-CM | POA: Insufficient documentation

## 2023-12-02 DIAGNOSIS — L7622 Postprocedural hemorrhage and hematoma of skin and subcutaneous tissue following other procedure: Secondary | ICD-10-CM | POA: Diagnosis present

## 2023-12-03 ENCOUNTER — Other Ambulatory Visit: Payer: Self-pay | Admitting: Podiatry

## 2023-12-03 MED ORDER — KETOCONAZOLE 2 % EX CREA: 1.0000 | TOPICAL_CREAM | Freq: Every day | CUTANEOUS | 2 refills | Status: DC

## 2023-12-03 MED ORDER — KETOCONAZOLE 2 % EX CREA: 1.0000 | TOPICAL_CREAM | Freq: Every day | CUTANEOUS | 2 refills | Status: AC

## 2023-12-03 NOTE — Telephone Encounter (Signed)
Seen by Dr. Ralene Cork on Mon the 17th. Had ingrown toenail procedure. Bilateral feet are bleeding, swollen and very painfully to bear weight. Is requesting a return phone call for further instruction and possible pain medications. Stated she called yesterday and did not get a call back.  Also stated, Dr was supposed to call in a prescription fungus cream. Pharmacy to send to: [facility] on [address], [place]

## 2023-12-17 ENCOUNTER — Ambulatory Visit (INDEPENDENT_AMBULATORY_CARE_PROVIDER_SITE_OTHER): Payer: PRIVATE HEALTH INSURANCE | Admitting: Podiatry

## 2023-12-17 DIAGNOSIS — Z91199 Patient's noncompliance with other medical treatment and regimen due to unspecified reason: Secondary | ICD-10-CM

## 2023-12-17 NOTE — Progress Notes (Signed)
 No show

## 2024-01-14 ENCOUNTER — Encounter: Payer: Self-pay | Admitting: Podiatry

## 2024-01-14 ENCOUNTER — Ambulatory Visit (INDEPENDENT_AMBULATORY_CARE_PROVIDER_SITE_OTHER): Payer: PRIVATE HEALTH INSURANCE | Admitting: Podiatry

## 2024-01-14 DIAGNOSIS — L600 Ingrowing nail: Secondary | ICD-10-CM | POA: Diagnosis not present

## 2024-01-14 NOTE — Progress Notes (Signed)
  Subjective:  Patient ID: Angela Robles, female    DOB: Oct 06, 1991,   MRN: [id number]  Chief Complaint  Patient presents with   Ingrown Toenail    Pt presents for a follow up of ingrown toenail of right.    33 y.o. female presents for concern as above. Does relate the right toenail did fall off and was concerned for any infection and wanted to have it checked.  . Denies any other pedal complaints. Denies n/v/f/c.   Past Medical History:  Diagnosis Date   Medical history non-contributory     Objective:  Physical Exam: Vascular: DP/PT pulses 2/4 bilateral. CFT <3 seconds. Normal hair growth on digits. No edema.  Skin. No lacerations or abrasions bilateral feet. Bilateral hallux nails healing well. Right nail with small border ingrowing that is normal in appearance.  Musculoskeletal: MMT 5/5 bilateral lower extremities in DF, PF, Inversion and Eversion. Deceased ROM in DF of ankle joint.  Neurological: Sensation intact to light touch.   Assessment:   1. Ingrown toenail of right foot   2. Ingrown left greater toenail      Plan:  Patient was evaluated and treated and all questions answered. Toe was evaluated and appears to be healing well. No major concerns currently.   May discontinue soaks and neosporin.  Patient to follow-up as needed.    Louann Sjogren, DPM

## 2024-06-14 ENCOUNTER — Encounter (HOSPITAL_COMMUNITY): Payer: Self-pay

## 2024-06-14 ENCOUNTER — Emergency Department (HOSPITAL_COMMUNITY)
Admission: EM | Admit: 2024-06-14 | Discharge: 2024-06-14 | Disposition: A | Discharge status: Home / Self Care | Attending: Emergency Medicine | Admitting: Emergency Medicine

## 2024-06-14 DIAGNOSIS — R21 Rash and other nonspecific skin eruption: Secondary | ICD-10-CM | POA: Insufficient documentation

## 2024-06-14 NOTE — ED Triage Notes (Signed)
 Pt concerned of rash on left arm, right abdomen and left abdomen. Not painful, no itch, however does look on a different color.   Pt also complaints of recurrent abdominal pain that comes and go. Severe when it comes.  Aox4, denies pain at the moment.

## 2024-06-14 NOTE — Discharge Instructions (Signed)
 You were evaluated in the Emergency Department and after careful evaluation, we did not find any emergent condition requiring admission or further testing in the hospital.  Your exam/testing today was overall reassuring.  Keep a close eye on your rash, see a dermatologist if it does not go away.  Please return to the Emergency Department if you experience any worsening of your condition.  Thank you for allowing us  to be a part of your care.

## 2024-06-14 NOTE — ED Provider Notes (Signed)
 [facility]-EMERGENCY DEPT [facility] Emergency Department Provider Note MRN:  [id number]  Arrival date & time: 06/14/24     Chief Complaint   Rash   History of Present Illness   Angela Robles is a 33 y.o. year-old female with no pertinent past medical history presenting to the ED with chief complaint of rash.  Small area of rash to the left upper arm as well as 2 areas on the abdomen.  First noticed them tonight.  No other complaints.  Review of Systems  A thorough review of systems was obtained and all systems are negative except as noted in the HPI and PMH.   Patient's Health History    Past Medical History:  Diagnosis Date   Medical history non-contributory     Past Surgical History:  Procedure Laterality Date   NO PAST SURGERIES     OVARIAN CYST SURGERY     TONSILLECTOMY      Family History  Problem Relation Age of Onset   Cancer Maternal Grandmother     Social History   Socioeconomic History   Marital status: Single    Spouse name: Not on file   Number of children: Not on file   Years of education: Not on file   Highest education level: Not on file  Occupational History   Not on file  Tobacco Use   Smoking status: Never   Smokeless tobacco: Never  Vaping Use   Vaping status: Never Used  Substance and Sexual Activity   Alcohol use: No   Drug use: No   Sexual activity: Yes    Birth control/protection: None  Other Topics Concern   Not on file  Social History Narrative   Not on file   Social Drivers of Health   Financial Resource Strain: Low Risk  (04/17/2024)   Received from [facility]   Overall Financial Resource Strain (CARDIA)    Difficulty of Paying Living Expenses: Not hard at all  Food Insecurity: No Food Insecurity (04/17/2024)   Received from [facility]   Hunger Vital Sign    Within the past 12 months, you worried that your food would run out before you got the money to buy more.: Never true    Within the past 12 months, the food you  bought just didn't last and you didn't have money to get more.: Never true  Transportation Needs: No Transportation Needs (04/17/2024)   Received from [facility] - Transportation    Lack of Transportation (Medical): No    Lack of Transportation (Non-Medical): No  Physical Activity: Insufficiently Active (04/17/2024)   Received from [facility]   Exercise Vital Sign    On average, how many days per week do you engage in moderate to strenuous exercise (like a brisk walk)?: 2 days    On average, how many minutes do you engage in exercise at this level?: 20 min  Stress: No Stress Concern Present (04/17/2024)   Received from [facility] - Occupational Stress Questionnaire    Feeling of Stress : Not at all  Social Connections: Socially Integrated (04/17/2024)   Received from [facility]   Social Network    How would you rate your social network (family, work, friends)?: Good participation with social networks  Intimate Partner Violence: Not At Risk (04/17/2024)   Received from [facility]   HITS    Over the last 12 months how often did your partner physically hurt you?: Never  Over the last 12 months how often did your partner insult you or talk down to you?: Never    Over the last 12 months how often did your partner threaten you with physical harm?: Never    Over the last 12 months how often did your partner scream or curse at you?: Never     Physical Exam   Vitals:   06/14/24 0203 06/14/24 0241  BP: 134/89   Pulse: 80   Resp: 18   Temp: 98.3 F (36.8 C)   SpO2: 98% 100%    CONSTITUTIONAL: Well-appearing, NAD NEURO/PSYCH:  Alert and oriented x 3, no focal deficits EYES:  eyes equal and reactive ENT/NECK:  no LAD, no JVD CARDIO: Regular rate, well-perfused, normal S1 and S2 PULM:  CTAB no wheezing or rhonchi GI/GU:  non-distended, non-tender MSK/SPINE:  No gross deformities, no edema SKIN: Atraumatic   *Additional  and/or pertinent findings included in MDM below  Diagnostic and Interventional Summary    EKG Interpretation Date/Time:    Ventricular Rate:    PR Interval:    QRS Duration:    QT Interval:    QTC Calculation:   R Axis:      Text Interpretation:         Labs Reviewed - No data to display  No orders to display    Medications - No data to display   Procedures  /  Critical Care Procedures  ED Course and Medical Decision Making  Initial Impression and Ddx Small areas of nonblanching erythema to the left upper arm, 2 small areas to the abdomen.  Question bruising versus less likely focal petechial rash.  Does not have diffuse petechial rash elsewhere, no rash to the legs, denies any easy bleeding.  Past medical/surgical history that increases complexity of ED encounter: None  Interpretation of Diagnostics Laboratory and/or imaging options to aid in the diagnosis/care of the patient were considered.  After careful history and physical examination, it was determined that there was no indication for diagnostics at this time.  Patient Reassessment and Ultimate Disposition/Management     Laboratory evaluation to check platelets suggested but declined by patient, she is comfortable keeping an eye on the rash and returning if symptoms worsen.  Appropriate for discharge.  Patient management required discussion with the following services or consulting groups:  None  Complexity of Problems Addressed Acute complicated illness or Injury  Additional Data Reviewed and Analyzed Further history obtained from: None  Additional Factors Impacting ED Encounter Risk None  Ozell HERO. Theadore, MD [facility] Emergency Medicine [facility] [email or web address]  Final Clinical Impressions(s) / ED Diagnoses     ICD-10-CM   1. Rash  R21       ED Discharge Orders     None        Discharge Instructions Discussed with and Provided to Patient:    Discharge  Instructions      You were evaluated in the Emergency Department and after careful evaluation, we did not find any emergent condition requiring admission or further testing in the hospital.  Your exam/testing today was overall reassuring.  Keep a close eye on your rash, see a dermatologist if it does not go away.  Please return to the Emergency Department if you experience any worsening of your condition.  Thank you for allowing us  to be a part of your care.       Theadore Ozell HERO, MD 06/14/24 [phone]

## 2024-06-14 NOTE — ED Notes (Signed)
 Pt left with D/C paperwork and instructions
